# Patient Record
Sex: Female | Born: 1960 | Race: White | Hispanic: No | Marital: Married | Smoking: Current every day smoker
Health system: Southern US, Community
[De-identification: ages and names within clinical notes are randomized; demographics above are authoritative.]

## PROBLEM LIST (undated history)

## (undated) DIAGNOSIS — C439 Malignant melanoma of skin, unspecified: Secondary | ICD-10-CM

## (undated) DIAGNOSIS — F329 Major depressive disorder, single episode, unspecified: Secondary | ICD-10-CM

## (undated) DIAGNOSIS — F32A Depression, unspecified: Secondary | ICD-10-CM

## (undated) DIAGNOSIS — F419 Anxiety disorder, unspecified: Secondary | ICD-10-CM

## (undated) DIAGNOSIS — I10 Essential (primary) hypertension: Secondary | ICD-10-CM

## (undated) DIAGNOSIS — T7840XA Allergy, unspecified, initial encounter: Secondary | ICD-10-CM

## (undated) HISTORY — PX: TONSILLECTOMY AND ADENOIDECTOMY: SUR1326

## (undated) HISTORY — DX: Allergy, unspecified, initial encounter: T78.40XA

## (undated) HISTORY — DX: Malignant melanoma of skin, unspecified: C43.9

## (undated) HISTORY — PX: MELANOMA EXCISION: SHX5266

---

## 1998-07-23 ENCOUNTER — Emergency Department (HOSPITAL_COMMUNITY): Admission: EM | Admit: 1998-07-23 | Discharge: 1998-07-23 | Payer: Self-pay | Admitting: Emergency Medicine

## 2004-08-20 ENCOUNTER — Emergency Department: Payer: Self-pay | Admitting: Internal Medicine

## 2004-10-27 DIAGNOSIS — I10 Essential (primary) hypertension: Secondary | ICD-10-CM

## 2004-10-27 HISTORY — DX: Essential (primary) hypertension: I10

## 2012-05-07 ENCOUNTER — Other Ambulatory Visit (HOSPITAL_COMMUNITY)
Admission: RE | Admit: 2012-05-07 | Discharge: 2012-05-07 | Disposition: A | Discharge status: Home / Self Care | Payer: 59 | Source: Ambulatory Visit | Attending: Internal Medicine | Admitting: Internal Medicine

## 2012-05-07 DIAGNOSIS — Z01419 Encounter for gynecological examination (general) (routine) without abnormal findings: Secondary | ICD-10-CM | POA: Insufficient documentation

## 2012-05-07 DIAGNOSIS — Z1151 Encounter for screening for human papillomavirus (HPV): Secondary | ICD-10-CM | POA: Insufficient documentation

## 2012-05-14 ENCOUNTER — Other Ambulatory Visit: Payer: Self-pay | Admitting: Internal Medicine

## 2012-05-14 ENCOUNTER — Encounter: Payer: Self-pay | Admitting: Gastroenterology

## 2012-05-14 DIAGNOSIS — N63 Unspecified lump in unspecified breast: Secondary | ICD-10-CM

## 2012-05-19 ENCOUNTER — Other Ambulatory Visit: Payer: 59

## 2012-07-13 ENCOUNTER — Encounter: Payer: 59 | Admitting: Gastroenterology

## 2013-05-10 ENCOUNTER — Encounter: Payer: Self-pay | Admitting: Internal Medicine

## 2013-06-28 ENCOUNTER — Encounter: Payer: 59 | Admitting: Internal Medicine

## 2013-07-22 ENCOUNTER — Emergency Department (HOSPITAL_COMMUNITY)
Admission: EM | Admit: 2013-07-22 | Discharge: 2013-07-22 | Disposition: A | Discharge status: Home / Self Care | Payer: 59 | Attending: Emergency Medicine | Admitting: Emergency Medicine

## 2013-07-22 ENCOUNTER — Encounter (HOSPITAL_COMMUNITY): Payer: Self-pay

## 2013-07-22 DIAGNOSIS — F32A Depression, unspecified: Secondary | ICD-10-CM

## 2013-07-22 DIAGNOSIS — I10 Essential (primary) hypertension: Secondary | ICD-10-CM | POA: Insufficient documentation

## 2013-07-22 DIAGNOSIS — Y929 Unspecified place or not applicable: Secondary | ICD-10-CM | POA: Insufficient documentation

## 2013-07-22 DIAGNOSIS — T424X1A Poisoning by benzodiazepines, accidental (unintentional), initial encounter: Secondary | ICD-10-CM | POA: Insufficient documentation

## 2013-07-22 DIAGNOSIS — T503X1A Poisoning by electrolytic, caloric and water-balance agents, accidental (unintentional), initial encounter: Secondary | ICD-10-CM | POA: Insufficient documentation

## 2013-07-22 DIAGNOSIS — T502X1A Poisoning by carbonic-anhydrase inhibitors, benzothiadiazides and other diuretics, accidental (unintentional), initial encounter: Secondary | ICD-10-CM | POA: Insufficient documentation

## 2013-07-22 DIAGNOSIS — Y9389 Activity, other specified: Secondary | ICD-10-CM | POA: Insufficient documentation

## 2013-07-22 DIAGNOSIS — F3289 Other specified depressive episodes: Secondary | ICD-10-CM | POA: Insufficient documentation

## 2013-07-22 DIAGNOSIS — F329 Major depressive disorder, single episode, unspecified: Secondary | ICD-10-CM

## 2013-07-22 DIAGNOSIS — Z3202 Encounter for pregnancy test, result negative: Secondary | ICD-10-CM | POA: Insufficient documentation

## 2013-07-22 DIAGNOSIS — Z79899 Other long term (current) drug therapy: Secondary | ICD-10-CM | POA: Insufficient documentation

## 2013-07-22 DIAGNOSIS — T424X4A Poisoning by benzodiazepines, undetermined, initial encounter: Secondary | ICD-10-CM | POA: Insufficient documentation

## 2013-07-22 DIAGNOSIS — F411 Generalized anxiety disorder: Secondary | ICD-10-CM | POA: Insufficient documentation

## 2013-07-22 DIAGNOSIS — F17200 Nicotine dependence, unspecified, uncomplicated: Secondary | ICD-10-CM | POA: Insufficient documentation

## 2013-07-22 DIAGNOSIS — T50901A Poisoning by unspecified drugs, medicaments and biological substances, accidental (unintentional), initial encounter: Secondary | ICD-10-CM

## 2013-07-22 HISTORY — DX: Major depressive disorder, single episode, unspecified: F32.9

## 2013-07-22 HISTORY — DX: Depression, unspecified: F32.A

## 2013-07-22 HISTORY — DX: Anxiety disorder, unspecified: F41.9

## 2013-07-22 HISTORY — DX: Essential (primary) hypertension: I10

## 2013-07-22 LAB — RAPID URINE DRUG SCREEN, HOSP PERFORMED
Amphetamines: NOT DETECTED
Barbiturates: NOT DETECTED
Benzodiazepines: NOT DETECTED
Cocaine: NOT DETECTED
Opiates: NOT DETECTED

## 2013-07-22 LAB — COMPREHENSIVE METABOLIC PANEL
ALT: 16 U/L (ref 0–35)
AST: 20 U/L (ref 0–37)
Albumin: 3.9 g/dL (ref 3.5–5.2)
BUN: 10 mg/dL (ref 6–23)
CO2: 29 mEq/L (ref 19–32)
Chloride: 101 mEq/L (ref 96–112)
Creatinine, Ser: 0.74 mg/dL (ref 0.50–1.10)
GFR calc non Af Amer: >90 mL/min (ref >90)
Sodium: 141 mEq/L (ref 135–145)
Total Bilirubin: 0.1 mg/dL — ABNORMAL LOW (ref 0.3–1.2)
Total Protein: 7.3 g/dL (ref 6.0–8.3)

## 2013-07-22 LAB — SALICYLATE LEVEL: Salicylate Lvl: <2 mg/dL — ABNORMAL LOW (ref 2.8–20.0)

## 2013-07-22 LAB — ETHANOL: Alcohol, Ethyl (B): 67 mg/dL — ABNORMAL HIGH (ref 0–11)

## 2013-07-22 LAB — CBC
Hemoglobin: 14.9 g/dL (ref 12.0–15.0)
MCH: 31.7 pg (ref 26.0–34.0)
Platelets: 256 10*3/uL (ref 150–400)
RBC: 4.7 MIL/uL (ref 3.87–5.11)
RDW: 12.8 % (ref 11.5–15.5)
WBC: 6.7 10*3/uL (ref 4.0–10.5)

## 2013-07-22 LAB — POCT PREGNANCY, URINE: Preg Test, Ur: NEGATIVE

## 2013-07-22 LAB — ACETAMINOPHEN LEVEL: Acetaminophen (Tylenol), Serum: <15 ug/mL (ref 10–30)

## 2013-07-22 MED ORDER — ACETAMINOPHEN 325 MG PO TABS: 650.0000 mg | ORAL_TABLET | ORAL | Status: DC | PRN

## 2013-07-22 MED ORDER — ONDANSETRON HCL 4 MG PO TABS: 4.0000 mg | ORAL_TABLET | Freq: Three times a day (TID) | ORAL | Status: DC | PRN

## 2013-07-22 MED ORDER — ALUM & MAG HYDROXIDE-SIMETH 200-200-20 MG/5ML PO SUSP: 30.0000 mL | ORAL | Status: DC | PRN

## 2013-07-22 MED ORDER — LORATADINE 10 MG PO TABS
10.0000 mg | ORAL_TABLET | Freq: Every day | ORAL | Status: DC
  Filled 2013-07-22: qty 1

## 2013-07-22 MED ORDER — IRBESARTAN 75 MG PO TABS
37.5000 mg | ORAL_TABLET | Freq: Every day | ORAL | Status: DC
  Filled 2013-07-22: qty 0.5

## 2013-07-22 MED ORDER — DULOXETINE HCL 60 MG PO CPEP
60.0000 mg | ORAL_CAPSULE | Freq: Every day | ORAL | Status: DC
  Administered 2013-07-22: 60 mg via ORAL
  Filled 2013-07-22: qty 1

## 2013-07-22 MED ORDER — LORAZEPAM 1 MG PO TABS: 1.0000 mg | ORAL_TABLET | Freq: Three times a day (TID) | ORAL | Status: DC | PRN

## 2013-07-22 NOTE — ED Notes (Signed)
Pt is awake and alert, pleasant and cooperative. Patient denies HI, SI AH or VH. Discharge vitals 132/85 HR 88 RR 16 and unlabored. Pt has outpatient treatment scheduled. Will continue to monitor for safety. Patient escorted to lobby without incident. T.Melvyn Neth RN

## 2013-07-22 NOTE — BH Assessment (Signed)
Tele Assessment Note   Sylvia Mclean is an 52 y.o. female. Patient presents to ED after taking an extra 4 xanax pills last night. Patient states she took the pills to sleep and not deal with conflicts in her life. Denies any suicidal ideation. Denies any desire to just not wake up. Patient stated she wanted to get her problems off of her mind. States she has a conflict with her 13 year old son. States son and his girlfriend had a baby last month and they will not allow patient to see baby.  Patient states she has a confict with the son's girlfriend and they will not allow her to see the baby because she did not come to the hospital when the baby was born.   Patient also has ongoing conflict with her boss of 10 years.  Last night her husband was angry with her and blames her for the conflict with her son.  Husband is also not permitted to see the baby and he blames patient for that.  Patient denies any drug use. Admits to drinking 6 beers a night, 4-5 times per week. She denies withdrawal symptoms and has no desire to stop drinking. She has had anxiety for the past 10 years that has been managed with cymbalta and PRN xanax. The medications have been working up until the last 2 months, when she became depressed over her son. Patient states she has 1-2 panic attacks per month. Her primary care doctor manages her medication. Patient saw a therapist approximately 5-6 years ago to deal with difficult boss. She denies suicidal ideation and homicidal ideation. Her husband has a gun at home but she states she doesn't like guns and she does not touch the gun.  She feels safe returning home. Patient wants to return to a therapist on an outpatient basis.  Discussed patient with Dr. Ladona Ridgel. Dr. Ladona Ridgel recommends discharge with outpatient referrals. Dr. Ladona Ridgel will see patient this am.   Axis I: Depressive Disorder NOS Axis II: Deferred Axis III:  Past Medical History  Diagnosis Date  . Hypertension   . Anxiety    . Depression    Axis IV: other psychosocial or environmental problems Axis V: 51-60 moderate symptoms  Past Medical History:  Past Medical History  Diagnosis Date  . Hypertension   . Anxiety   . Depression     Past Surgical History  Procedure Laterality Date  . Cesarean section      Family History: History reviewed. No pertinent family history.  Social History:  reports that she has been smoking Cigarettes.  She has been smoking about 1.00 pack per day. She does not have any smokeless tobacco history on file. She reports that she drinks about 3.6 ounces of alcohol per week. She reports that she does not use illicit drugs.  Additional Social History:  Alcohol / Drug Use Pain Medications: Denies Prescriptions: As prescribed Over the Counter: Denies History of alcohol / drug use?: Yes Withdrawal Symptoms:  (Denies withdrawal symptoms) Substance #1 Name of Substance 1: Beer 1 - Amount (size/oz): 6 (12) oz cans 1 - Frequency: 4-5 times per week 1 - Duration: years 1 - Last Use / Amount: last night  CIWA: CIWA-Ar BP: 139/97 mmHg Pulse Rate: 88 COWS:    Allergies:  Allergies  Allergen Reactions  . Azithromycin Nausea And Vomiting    Home Medications:  (Not in a hospital admission)  OB/GYN Status:  Patient's last menstrual period was 05/30/2013.  General Assessment Data Location of  Assessment: [facility] ACT Assessment:  (No) Is this a Tele or Face-to-Face Assessment?: Tele Assessment Is this an Initial Assessment or a Re-assessment for this encounter?: Initial Assessment Living Arrangements: Spouse/significant other Can pt return to current living arrangement?: Yes Admission Status: Voluntary Is patient capable of signing voluntary admission?: Yes Transfer from: Home Referral Source: Self/Family/Friend     [facility] Crisis Care Plan Living Arrangements: Spouse/significant other  Education Status Is patient currently in school?: No  Risk to self Suicidal  Ideation: No Suicidal Intent: No Is patient at risk for suicide?: No Suicidal Plan?: No Access to Means: Yes Specify Access to Suicidal Means:  (Patient states they have a gun in the home ) What has been your use of drugs/alcohol within the last 12 months?:  (6 beers nightly 4-5 times per week. ) Previous Attempts/Gestures: No How many times?:  (na/) Other Self Harm Risks:  (none) Triggers for Past Attempts:  (na) Intentional Self Injurious Behavior: None Family Suicide History: No Recent stressful life event(s): Conflict (Comment);Other (Comment) (conflict at work, with son, and with husband) Persecutory voices/beliefs?: No Depression: Yes Depression Symptoms: Insomnia;Tearfulness;Fatigue;Guilt;Loss of interest in usual pleasures;Feeling angry/irritable Substance abuse history and/or treatment for substance abuse?: No Suicide prevention information given to non-admitted patients: Not applicable  Risk to Others Homicidal Ideation: No Thoughts of Harm to Others: No Current Homicidal Intent: No Current Homicidal Plan: No Access to Homicidal Means: No Identified Victim:  (na) History of harm to others?: No Assessment of Violence: None Noted Violent Behavior Description:  (na) Does patient have access to weapons?: Yes (Comment) (gun in the home) Criminal Charges Pending?: No Does patient have a court date: No  Psychosis Hallucinations: None noted Delusions: None noted  Mental Status Report Appear/Hygiene: Other (Comment) (unremarkable) Eye Contact: Good Motor Activity: Freedom of movement;Unremarkable Speech: Logical/coherent Level of Consciousness: Alert Mood: Depressed;Anxious Affect: Depressed Anxiety Level: Moderate Thought Processes: Coherent;Relevant Judgement: Unimpaired Orientation: Person;Place;Time;Situation Obsessive Compulsive Thoughts/Behaviors: None  Cognitive Functioning Concentration: Decreased Memory: Remote Intact;Recent Impaired (states "forgetful"  since depressed) IQ: Average Insight: Good Impulse Control: Fair Appetite: Good Weight Loss:  (none) Weight Gain:  (none) Sleep: Decreased Vegetative Symptoms: Staying in bed;Decreased grooming  ADLScreening [facility] Assessment Services) Patient's cognitive ability adequate to safely complete daily activities?: Yes Patient able to express need for assistance with ADLs?: Yes Independently performs ADLs?: Yes (appropriate for developmental age)  Prior Inpatient Therapy Prior Inpatient Therapy: No Prior Therapy Dates:  (na)  Prior Outpatient Therapy Prior Outpatient Therapy: Yes (approxm 5-6 years ago) Prior Therapy Facilty/Provider(s):  (Dr. Raina Mina) Reason for Treatment:  (difficulty with boss)  ADL Screening (condition at time of admission) Patient's cognitive ability adequate to safely complete daily activities?: Yes Is the patient deaf or have difficulty hearing?: No Does the patient have difficulty seeing, even when wearing glasses/contacts?: No Does the patient have difficulty concentrating, remembering, or making decisions?: No Patient able to express need for assistance with ADLs?: Yes Does the patient have difficulty dressing or bathing?: No Independently performs ADLs?: Yes (appropriate for developmental age) Does the patient have difficulty walking or climbing stairs?: No Weakness of Legs: None Weakness of Arms/Hands: None  Home Assistive Devices/Equipment Home Assistive Devices/Equipment: None    Abuse/Neglect Assessment (Assessment to be complete while patient is alone) Physical Abuse: Denies Verbal Abuse: Denies Sexual Abuse: Denies Exploitation of patient/patient's resources: Denies Self-Neglect: Denies Values / Beliefs Cultural Requests During Hospitalization: None Spiritual Requests During Hospitalization: None   Advance Directives (For Healthcare) Advance Directive: Patient does not have  advance directive;Patient would like information Patient requests  advance directive information: Other (Comment) Nutrition Screen- [facility] Adult/[facility] Patient's home diet: Regular  Additional Information 1:1 In Past 12 Months?: No CIRT Risk: No Elopement Risk: No Does patient have medical clearance?: Yes     Disposition:  Disposition Initial Assessment Completed for this Encounter: Yes Disposition of Patient: Outpatient treatment Type of outpatient treatment: Adult (Per Dr. Ladona Ridgel refer to a therapist)  Yates Decamp 07/22/2013 10:40 AM

## 2013-07-22 NOTE — ED Provider Notes (Signed)
CSN: [id number]     Arrival date & time 07/22/13  0016 History   First MD Initiated Contact with Patient 07/22/13 0031     Chief Complaint  Patient presents with  . Drug Overdose    HPI Patient presents to the emergency room after an overdose of her anxiety medication. Patient has been having stress regarding family situations with her son. Patient states sometime this evening approximately 2-3 hours ago she took 3 or 4 extra of her Xanax medications. She denies taking any extra blood pressure medications. She just took her usual dose. Patient states she was not trying to harm herself however she does admit she was thinking that the world would be better off without her. She did send her son text message stating that. 911 was called and the patient was brought to the emergency room. She denies any active suicidal ideation at this time. She does have a history of depression. She denies any prior overdose. Past Medical History  Diagnosis Date  . Hypertension   . Anxiety   . Depression    Past Surgical History  Procedure Laterality Date  . Cesarean section     History reviewed. No pertinent family history. History  Substance Use Topics  . Smoking status: Current Every Day Smoker -- 1.00 packs/day    Types: Cigarettes  . Smokeless tobacco: Not on file  . Alcohol Use: 3.6 oz/week    6 Cans of beer per week     Comment: nightly for months   OB History   Grav Para Term Preterm Abortions TAB SAB Ect Mult Living                 Review of Systems  All other systems reviewed and are negative.    Allergies  Azithromycin  Home Medications   Current Outpatient Rx  Name  Route  Sig  Dispense  Refill  . ALPRAZolam (XANAX) 0.25 MG tablet   Oral   Take 0.25 mg by mouth 3 (three) times daily as needed for anxiety.          . cetirizine (ZYRTEC) 10 MG tablet   Oral   Take 10 mg by mouth daily.         . DULoxetine (CYMBALTA) 60 MG capsule   Oral   Take 60 mg by mouth daily.        Marland Kitchen olmesartan (BENICAR) 5 MG tablet   Oral   Take 5 mg by mouth daily.         . Vitamin D, Ergocalciferol, (DRISDOL) 50000 UNITS CAPS capsule   Oral   Take 50,000 Units by mouth every Monday, Wednesday, and Friday.          BP 98/58  Pulse 83  Resp 16  SpO2 94%  LMP 05/30/2013 Physical Exam  Nursing note and vitals reviewed. Constitutional: No distress.  HENT:  Head: Normocephalic and atraumatic.  Right Ear: External ear normal.  Left Ear: External ear normal.  Eyes: Conjunctivae are normal. Right eye exhibits no discharge. Left eye exhibits no discharge. No scleral icterus.  Neck: Neck supple. No tracheal deviation present.  Cardiovascular: Normal rate, regular rhythm and intact distal pulses.   Pulmonary/Chest: Effort normal and breath sounds normal. No stridor. No respiratory distress. She has no wheezes. She has no rales.  Abdominal: Soft. Bowel sounds are normal. She exhibits no distension. There is no tenderness. There is no rebound and no guarding.  Musculoskeletal: She exhibits no edema and no tenderness.  Neurological: She is alert. She has normal strength. No sensory deficit. Cranial nerve deficit:  no gross defecits noted. She exhibits normal muscle tone. She displays no seizure activity. Coordination normal.  Skin: Skin is warm and dry. No rash noted.  Psychiatric: She has a normal mood and affect.    ED Course  Procedures (including critical care time) Labs Review Labs Reviewed  COMPREHENSIVE METABOLIC PANEL - Abnormal; Notable for the following:    Glucose, Bld 100 (*)    Total Bilirubin 0.1 (*)    All other components within normal limits  ETHANOL - Abnormal; Notable for the following:    Alcohol, Ethyl (B) 67 (*)    All other components within normal limits  SALICYLATE LEVEL - Abnormal; Notable for the following:    Salicylate Lvl <2.0 (*)    All other components within normal limits  CBC  ACETAMINOPHEN LEVEL  URINE RAPID DRUG SCREEN (HOSP  PERFORMED)  POCT PREGNANCY, URINE   Imaging Review No results found.  MDM   1. Overdose, initial encounter   2. Depression    [phone]  Pt is medically cleared from her overdose.  Will have pt evaluated by the psychiatry team regarding her depression and possible suicide attempt.    Celene Kras, MD 07/22/13 [phone]

## 2013-07-22 NOTE — ED Notes (Signed)
Bed: [facility] Expected date: 07/22/13 Expected time: 12:01 AM Means of arrival: Ambulance Comments: SI, etoh, overdose on xanax

## 2013-07-22 NOTE — ED Notes (Signed)
TelePsych at bedside.

## 2013-07-22 NOTE — ED Provider Notes (Signed)
[facility] recs discharge and Pt agrees.  Hurman Horn, MD 07/23/13 [phone]

## 2013-07-22 NOTE — BH Assessment (Signed)
[facility] Assessment Progress Note Called [facility] and scheduled pt's telepsych appt for 0830.  [facility] secretary to inform pt's nurse.

## 2013-07-22 NOTE — Progress Notes (Signed)
Chaplain provided support with pt at bedside in [facility].     Pt requested information around advance directives.  Chaplain provided paperwork and spoke with pt about healthcare power of attorney and living will.   Provided support around grief and fear related to pt not being able to see granddaughter or son.   Belva Crome MDiv

## 2013-07-22 NOTE — ED Notes (Signed)
Per EMS, pt from home.  Pt has had a lot of family stress recently.  Hx depression, htn, anxiety.  Domestic issues at home.  Sent text message to her son saying that world would be better off without her.  Admitted that she took few "extra xanax and benicar".  Unsure of amount.  Says that she just wanted to sleep.  No history of same.  Pt is nauseous.  No vomiting.  cbg 90.  Vitals:164/52, 98% Ra, hr 80's.

## 2013-07-22 NOTE — ED Notes (Signed)
Pt is currently asking for no visitors.  Informed to let me know, if she changes her mind.

## 2013-07-22 NOTE — Progress Notes (Signed)
   CARE MANAGEMENT ED NOTE 07/22/2013  Patient:  Sylvia Mclean, Sylvia Mclean   Account Number:  [id number]  Date Initiated:  07/22/2013  Documentation initiated by:  Edd Arbour  Subjective/Objective Assessment:     Subjective/Objective Assessment Detail:   52 year old female [employer] coverage No pcp listed from home.  Pt has had a lot of family stress recently.  Hx depression, htn, anxiety.  Domestic issues at home.  Sent text message to her son saying that world would be better off without her.  Admitted that she took few "extra xanax and benicar".  Unsure of amount.  Says that she just wanted to sleep.     Action/Plan:   Action/Plan Detail:   Anticipated DC Date:       Status Recommendation to Physician:   Result of Recommendation:    Other ED Services  Consult Working Plan    DC Planning Services  Other  PCP issues  Outpatient Services - Pt will follow up    Choice offered to / List presented to:            Status of service:  Completed, signed off  ED Comments:   ED Comments Detail:  [facility] ED CM noted pt without pcp CM spoke with pt who states she sees a provider off of [address] [place] with the first name amanda, PA but can not recall the last name. Cm able to find Quentin Mulling PA listed near [address] [place] [facility] updated. Reports she previously saw Dr Ricky Stabs prior to this provider

## 2013-07-22 NOTE — ED Notes (Signed)
Pts husband came to see patient and it was explained to him that visiting hours were 9-9p, he states that he will be back in the am. Pt gave permission to give him information about her.

## 2013-07-22 NOTE — Consult Note (Signed)
[facility] Face-to-Face Psychiatry Consult   Reason for Consult:  Depressed and took extra Xanax with alcohol Referring Physician:  ER MD  Sylvia Mclean is an 52 y.o. female.  Assessment: AXIS I:  Major Depression, Recurrent severe AXIS II:  Deferred AXIS III:   Past Medical History  Diagnosis Date  . Hypertension   . Anxiety   . Depression    AXIS IV:  other psychosocial or environmental problems AXIS V:  51-60 moderate symptoms  Plan:  Patient does not meet criteria for psychiatric inpatient admission.  Subjective:   Sylvia Mclean is a 52 y.o. female patient admitted with depression .  HPI:  Ms Pask has been depressed for a long time and takes Cymbalta which has helped.  Her son and his wife recently had a baby but refuses to let her or her husband see the baby reportedly because his wife wants it that way.  Her husband blames her for the situation.  She has a very stressful job which has led to daily increased anxiety.  She takes Xanax for anxiety and that helps.  Last night she took 2 mg extra last night in addition to drinking just to go to sleep not to kill herself.  She wants to go home,  wants to go to her job.  She is not suicidal so does not meet criteria for involuntary commitment, so I recommend discharge ome today  HPI Elements:   Location:  ER. Quality:  stressed out but can cope. Severity:  moderate. Timing:  last 2 monthss. Duration:  2 months. Context:  family relationships.  Past Psychiatric History: Past Medical History  Diagnosis Date  . Hypertension   . Anxiety   . Depression     reports that she has been smoking Cigarettes.  She has been smoking about 1.00 pack per day. She does not have any smokeless tobacco history on file. She reports that she drinks about 3.6 ounces of alcohol per week. She reports that she does not use illicit drugs. History reviewed. No pertinent family history. Family History Substance Abuse: Yes, Describe: (Father alcoholic) Family  Supports: Yes, List: (mother and husband) Living Arrangements: Spouse/significant other Can pt return to current living arrangement?: Yes Abuse/Neglect [facility]) Physical Abuse: Denies Verbal Abuse: Denies Sexual Abuse: Denies Allergies:   Allergies  Allergen Reactions  . Azithromycin Nausea And Vomiting    ACT Assessment Complete:  Yes:    Educational Status    Risk to Self: Risk to self Suicidal Ideation: No Suicidal Intent: No Is patient at risk for suicide?: No Suicidal Plan?: No Access to Means: Yes Specify Access to Suicidal Means:  (Patient states they have a gun in the home ) What has been your use of drugs/alcohol within the last 12 months?:  (6 beers nightly 4-5 times per week. ) Previous Attempts/Gestures: No How many times?:  (na/) Other Self Harm Risks:  (none) Triggers for Past Attempts:  (na) Intentional Self Injurious Behavior: None Family Suicide History: No Recent stressful life event(s): Conflict (Comment);Other (Comment) (conflict at work, with son, and with husband) Persecutory voices/beliefs?: No Depression: Yes Depression Symptoms: Insomnia;Tearfulness;Fatigue;Guilt;Loss of interest in usual pleasures;Feeling angry/irritable Substance abuse history and/or treatment for substance abuse?: No Suicide prevention information given to non-admitted patients: Not applicable  Risk to Others: Risk to Others Homicidal Ideation: No Thoughts of Harm to Others: No Current Homicidal Intent: No Current Homicidal Plan: No Access to Homicidal Means: No Identified Victim:  (na) History of harm to others?: No Assessment of  Violence: None Noted Violent Behavior Description:  (na) Does patient have access to weapons?: Yes (Comment) (gun in the home) Criminal Charges Pending?: No Does patient have a court date: No  Abuse: Abuse/Neglect Assessment (Assessment to be complete while patient is alone) Physical Abuse: Denies Verbal Abuse: Denies Sexual Abuse:  Denies Exploitation of patient/patient's resources: Denies Self-Neglect: Denies  Prior Inpatient Therapy: Prior Inpatient Therapy Prior Inpatient Therapy: No Prior Therapy Dates:  (na)  Prior Outpatient Therapy: Prior Outpatient Therapy Prior Outpatient Therapy: Yes (approxm 5-6 years ago) Prior Therapy Facilty/Provider(s):  (Dr. Raina Mina) Reason for Treatment:  (difficulty with boss)  Additional Information: Additional Information 1:1 In Past 12 Months?: No CIRT Risk: No Elopement Risk: No Does patient have medical clearance?: Yes                  Objective: Blood pressure 139/97, pulse 88, resp. rate 21, last menstrual period 05/30/2013, SpO2 98.00%.There is no height or weight on file to calculate BMI. Results for orders placed during the hospital encounter of 07/22/13 (from the past 72 hour(s))  URINE RAPID DRUG SCREEN (HOSP PERFORMED)     Status: None   Collection Time    07/22/13 12:37 AM      Result Value Range   Opiates NONE DETECTED  NONE DETECTED   Cocaine NONE DETECTED  NONE DETECTED   Benzodiazepines NONE DETECTED  NONE DETECTED   Amphetamines NONE DETECTED  NONE DETECTED   Tetrahydrocannabinol NONE DETECTED  NONE DETECTED   Barbiturates NONE DETECTED  NONE DETECTED   Comment:            DRUG SCREEN FOR MEDICAL PURPOSES     ONLY.  IF CONFIRMATION IS NEEDED     FOR ANY PURPOSE, NOTIFY LAB     WITHIN 5 DAYS.                LOWEST DETECTABLE LIMITS     FOR URINE DRUG SCREEN     Drug Class       Cutoff (ng/mL)     Amphetamine      1000     Barbiturate      200     Benzodiazepine   200     Tricyclics       300     Opiates          300     Cocaine          300     THC              50  POCT PREGNANCY, URINE     Status: None   Collection Time    07/22/13 12:49 AM      Result Value Range   Preg Test, Ur NEGATIVE  NEGATIVE   Comment:            THE SENSITIVITY OF THIS     METHODOLOGY IS >24 mIU/mL  CBC     Status: None   Collection Time    07/22/13   1:00 AM      Result Value Range   WBC 6.7  4.0 - 10.5 K/uL   RBC 4.70  3.87 - 5.11 MIL/uL   Hemoglobin 14.9  12.0 - 15.0 g/dL   HCT 16.1  09.6 - 04.5 %   MCV 92.6  78.0 - 100.0 fL   MCH 31.7  26.0 - 34.0 pg   MCHC 34.3  30.0 - 36.0 g/dL   RDW 40.9  81.1 - 91.4 %  Platelets 256  150 - 400 K/uL  COMPREHENSIVE METABOLIC PANEL     Status: Abnormal   Collection Time    07/22/13  1:00 AM      Result Value Range   Sodium 141  135 - 145 mEq/L   Potassium 4.2  3.5 - 5.1 mEq/L   Chloride 101  96 - 112 mEq/L   CO2 29  19 - 32 mEq/L   Glucose, Bld 100 (*) 70 - 99 mg/dL   BUN 10  6 - 23 mg/dL   Creatinine, Ser 4.54  0.50 - 1.10 mg/dL   Calcium 09.8  8.4 - 11.9 mg/dL   Total Protein 7.3  6.0 - 8.3 g/dL   Albumin 3.9  3.5 - 5.2 g/dL   AST 20  0 - 37 U/L   ALT 16  0 - 35 U/L   Alkaline Phosphatase 80  39 - 117 U/L   Total Bilirubin 0.1 (*) 0.3 - 1.2 mg/dL   GFR calc non Af Amer >90  >90 mL/min   GFR calc Af Amer >90  >90 mL/min   Comment: (NOTE)     The eGFR has been calculated using the CKD EPI equation.     This calculation has not been validated in all clinical situations.     eGFR's persistently <90 mL/min signify possible Chronic Kidney     Disease.  ETHANOL     Status: Abnormal   Collection Time    07/22/13  1:00 AM      Result Value Range   Alcohol, Ethyl (B) 67 (*) 0 - 11 mg/dL   Comment:            LOWEST DETECTABLE LIMIT FOR     SERUM ALCOHOL IS 11 mg/dL     FOR MEDICAL PURPOSES ONLY  ACETAMINOPHEN LEVEL     Status: None   Collection Time    07/22/13  1:00 AM      Result Value Range   Acetaminophen (Tylenol), Serum <15.0  10 - 30 ug/mL   Comment:            THERAPEUTIC CONCENTRATIONS VARY     SIGNIFICANTLY. A RANGE OF 10-30     ug/mL MAY BE AN EFFECTIVE     CONCENTRATION FOR MANY PATIENTS.     HOWEVER, SOME ARE BEST TREATED     AT CONCENTRATIONS OUTSIDE THIS     RANGE.     ACETAMINOPHEN CONCENTRATIONS     >150 ug/mL AT 4 HOURS AFTER     INGESTION AND >50 ug/mL  AT 12     HOURS AFTER INGESTION ARE     OFTEN ASSOCIATED WITH TOXIC     REACTIONS.  SALICYLATE LEVEL     Status: Abnormal   Collection Time    07/22/13  1:00 AM      Result Value Range   Salicylate Lvl <2.0 (*) 2.8 - 20.0 mg/dL   Labs are reviewed and are pertinent for no psychiatric issue.  Current Facility-Administered Medications  Medication Dose Route Frequency Provider Last Rate Last Dose  . acetaminophen (TYLENOL) tablet 650 mg  650 mg Oral Q4H PRN Celene Kras, MD      . alum & mag hydroxide-simeth (MAALOX/MYLANTA) 200-200-20 MG/5ML suspension 30 mL  30 mL Oral PRN Celene Kras, MD      . DULoxetine (CYMBALTA) DR capsule 60 mg  60 mg Oral Daily Celene Kras, MD   60 mg at 07/22/13 1025  . irbesartan (AVAPRO)  tablet 37.5 mg  37.5 mg Oral Daily Celene Kras, MD      . loratadine (CLARITIN) tablet 10 mg  10 mg Oral Daily Celene Kras, MD      . LORazepam (ATIVAN) tablet 1 mg  1 mg Oral Q8H PRN Celene Kras, MD      . ondansetron [facility]) tablet 4 mg  4 mg Oral Q8H PRN Celene Kras, MD       Current Outpatient Prescriptions  Medication Sig Dispense Refill  . ALPRAZolam (XANAX) 0.25 MG tablet Take 0.25 mg by mouth 3 (three) times daily as needed for anxiety.       . cetirizine (ZYRTEC) 10 MG tablet Take 10 mg by mouth daily.      . DULoxetine (CYMBALTA) 60 MG capsule Take 60 mg by mouth daily.      Marland Kitchen olmesartan (BENICAR) 5 MG tablet Take 5 mg by mouth daily.      . Vitamin D, Ergocalciferol, (DRISDOL) 50000 UNITS CAPS capsule Take 50,000 Units by mouth every Monday, Wednesday, and Friday.        Psychiatric Specialty Exam:     Blood pressure 139/97, pulse 88, resp. rate 21, last menstrual period 05/30/2013, SpO2 98.00%.There is no height or weight on file to calculate BMI.  General Appearance: Casual  Eye Contact::  Good  Speech:  Clear and Coherent and Normal Rate  Volume:  Normal  Mood:  Anxious  Affect:  Appropriate  Thought Process:  Coherent, Goal Directed and Logical   Orientation:  Full (Time, Place, and Person)  Thought Content:  Negative  Suicidal Thoughts:  No  Homicidal Thoughts:  No  Memory:  Immediate;   Good Recent;   Good Remote;   Good  Judgement:  Good  Insight:  Good  Psychomotor Activity:  Normal  Concentration:  Good  Recall:  Good  Akathisia:  Negative  Handed:  Right  AIMS (if indicated):     Assets:  Communication Skills Desire for Improvement Financial Resources/Insurance Housing Intimacy Leisure Time Physical Health Talents/Skills Transportation Vocational/Educational  Sleep:      Treatment Plan Summary: discharge home today  Santhiago Collingsworth D 07/22/2013 11:01 AM

## 2013-08-05 ENCOUNTER — Encounter: Payer: Self-pay | Admitting: *Deleted

## 2013-08-05 ENCOUNTER — Telehealth: Payer: Self-pay | Admitting: *Deleted

## 2013-08-05 NOTE — Telephone Encounter (Signed)
Phone call to patient regarding no show for previsit appointment 08/05/13 800 am. Message left to call and reschedule previsit before 5 pm close today in order to keep colonoscopy scheduled for 08/19/13. Should appointment not be rescheduled today, colonoscopy will be cancelled and both appointments would need to be rescheduled for future.

## 2013-08-12 ENCOUNTER — Encounter: Payer: Self-pay | Admitting: Internal Medicine

## 2013-08-19 ENCOUNTER — Encounter: Payer: 59 | Admitting: Internal Medicine

## 2013-09-21 ENCOUNTER — Other Ambulatory Visit: Payer: Self-pay | Admitting: Internal Medicine

## 2013-10-24 ENCOUNTER — Other Ambulatory Visit: Payer: Self-pay | Admitting: Physician Assistant

## 2013-10-24 MED ORDER — VITAMIN D (ERGOCALCIFEROL) 1.25 MG (50000 UNIT) PO CAPS: 50000.0000 [IU] | ORAL_CAPSULE | ORAL | Status: DC

## 2013-11-17 ENCOUNTER — Other Ambulatory Visit: Payer: Self-pay | Admitting: Internal Medicine

## 2013-11-18 ENCOUNTER — Other Ambulatory Visit: Payer: Self-pay | Admitting: Internal Medicine

## 2013-11-21 DIAGNOSIS — I10 Essential (primary) hypertension: Secondary | ICD-10-CM | POA: Insufficient documentation

## 2013-11-21 DIAGNOSIS — T7840XA Allergy, unspecified, initial encounter: Secondary | ICD-10-CM | POA: Insufficient documentation

## 2013-11-21 DIAGNOSIS — F325 Major depressive disorder, single episode, in full remission: Secondary | ICD-10-CM | POA: Insufficient documentation

## 2013-11-21 DIAGNOSIS — J309 Allergic rhinitis, unspecified: Secondary | ICD-10-CM | POA: Insufficient documentation

## 2013-11-21 DIAGNOSIS — F419 Anxiety disorder, unspecified: Secondary | ICD-10-CM | POA: Insufficient documentation

## 2013-11-22 ENCOUNTER — Ambulatory Visit: Payer: Self-pay | Admitting: Emergency Medicine

## 2013-11-24 ENCOUNTER — Ambulatory Visit: Payer: Self-pay | Admitting: Physician Assistant

## 2013-11-24 ENCOUNTER — Ambulatory Visit: Payer: Self-pay | Admitting: Emergency Medicine

## 2013-12-15 ENCOUNTER — Encounter: Payer: Self-pay | Admitting: Physician Assistant

## 2013-12-15 ENCOUNTER — Ambulatory Visit (INDEPENDENT_AMBULATORY_CARE_PROVIDER_SITE_OTHER): Payer: 59 | Admitting: Physician Assistant

## 2013-12-15 VITALS — BP 128/82 | HR 76 | Temp 97.9°F | Resp 16 | Ht 62.0 in | Wt 160.0 lb

## 2013-12-15 DIAGNOSIS — N951 Menopausal and female climacteric states: Secondary | ICD-10-CM

## 2013-12-15 DIAGNOSIS — R232 Flushing: Secondary | ICD-10-CM

## 2013-12-15 DIAGNOSIS — G4700 Insomnia, unspecified: Secondary | ICD-10-CM

## 2013-12-15 DIAGNOSIS — F411 Generalized anxiety disorder: Secondary | ICD-10-CM

## 2013-12-15 DIAGNOSIS — K219 Gastro-esophageal reflux disease without esophagitis: Secondary | ICD-10-CM

## 2013-12-15 MED ORDER — ESOMEPRAZOLE MAGNESIUM 40 MG PO CPDR: 40.0000 mg | DELAYED_RELEASE_CAPSULE | Freq: Every day | ORAL | Status: DC

## 2013-12-15 MED ORDER — ALPRAZOLAM 0.5 MG PO TABS: 0.5000 mg | ORAL_TABLET | Freq: Three times a day (TID) | ORAL | Status: DC | PRN

## 2013-12-15 NOTE — Patient Instructions (Addendum)
[facility]- call [phone] Call for colonoscopy  Different supplement Sylvia Mclean

## 2013-12-15 NOTE — Progress Notes (Signed)
HPI Patient presents for a folllow up of 6 months, all of her labs were good last time. Her BP is controlled. She states that she is having hot flashes at night and it is decreasing her sleep, no menses since mid Dec. She takes xanax at night and this helps her sleep. Denies CP, SOB, nausea, palpitations.   Past Medical History  Diagnosis Date  . Hypertension   . Depression   . Anxiety   . Allergy      Allergies  Allergen Reactions  . Azithromycin Nausea And Vomiting     Current Outpatient Prescriptions on File Prior to Visit  Medication Sig Dispense Refill  . ALPRAZolam (XANAX) 0.5 MG tablet TAKE 1/2 TO 1 TABLET BY MOUTH THREE TIMES A DAY AS NEEDED FOR ANXIETY  90 tablet  0  . BENICAR HCT 20-12.5 MG per tablet TAKE ONE (1) TABLET BY MOUTH EVERY DAY  30 tablet  2  . cetirizine (ZYRTEC) 10 MG tablet Take 10 mg by mouth daily.      . DULoxetine (CYMBALTA) 60 MG capsule Take 60 mg by mouth daily.      . Vitamin D, Ergocalciferol, (DRISDOL) 50000 UNITS CAPS capsule Take 1 capsule (50,000 Units total) by mouth every Monday, Wednesday, and Friday.  12 capsule  2   No current facility-administered medications on file prior to visit.    ROS: all negative expect above.   Physical: Filed Weights   12/15/13 1110  Weight: 160 lb (72.576 kg)   Filed Vitals:   12/15/13 1110  BP: 128/82  Pulse: 76  Temp: 97.9 F (36.6 C)  Resp: 16   General Appearance: Well nourished, in no apparent distress. Eyes: PERRLA, EOMs. Sinuses: No Frontal/maxillary tenderness ENT/Mouth: Ext aud canals clear, normal light reflex with TMs without erythema, bulging. Post pharynx without erythema, swelling, exudate.  Respiratory: CTAB Cardio: RRR, no murmurs, rubs or gallops. Peripheral pulses brisk and equal bilaterally, without edema. No aortic or femoral bruits. Abdomen: Soft, with bowl sounds. Nontender, no guarding, rebound. Lymphatics: Non tender without lymphadenopathy.  Musculoskeletal: Full ROM all  peripheral extremities, 5/5 strength, and normal gait. Skin: Warm, dry without rashes, lesions, ecchymosis.  Neuro: Cranial nerves intact, reflexes equal bilaterally. Normal muscle tone, no cerebellar symptoms. Sensation intact.  Pysch: Awake and oriented X 3, normal affect, Insight and Judgment appropriate.   Assessment and Plan: 1) Anxiety- Xanax 0.5#90 1 refill 2) GERD- nexium refill 3) Hot flashes- does not want to recheck [facility] or labs, and would not want estrogen supplement.

## 2014-01-04 ENCOUNTER — Other Ambulatory Visit: Payer: Self-pay

## 2014-01-04 MED ORDER — OLMESARTAN MEDOXOMIL-HCTZ 20-12.5 MG PO TABS: 1.0000 | ORAL_TABLET | Freq: Every day | ORAL | Status: DC

## 2014-02-01 ENCOUNTER — Other Ambulatory Visit: Payer: Self-pay

## 2014-02-01 MED ORDER — VITAMIN D (ERGOCALCIFEROL) 1.25 MG (50000 UNIT) PO CAPS: 50000.0000 [IU] | ORAL_CAPSULE | ORAL | Status: DC

## 2014-02-17 ENCOUNTER — Other Ambulatory Visit: Payer: Self-pay | Admitting: Internal Medicine

## 2014-03-03 ENCOUNTER — Other Ambulatory Visit: Payer: Self-pay | Admitting: Internal Medicine

## 2014-03-08 ENCOUNTER — Other Ambulatory Visit: Payer: Self-pay | Admitting: Physician Assistant

## 2014-03-08 MED ORDER — ALPRAZOLAM 0.5 MG PO TABS: 0.5000 mg | ORAL_TABLET | Freq: Three times a day (TID) | ORAL | Status: DC | PRN

## 2014-04-07 ENCOUNTER — Encounter: Payer: Self-pay | Admitting: Emergency Medicine

## 2014-04-07 ENCOUNTER — Ambulatory Visit (INDEPENDENT_AMBULATORY_CARE_PROVIDER_SITE_OTHER): Payer: 59 | Admitting: Emergency Medicine

## 2014-04-07 VITALS — BP 110/70 | HR 76 | Temp 98.1°F | Resp 16 | Ht 62.0 in | Wt 157.0 lb

## 2014-04-07 DIAGNOSIS — J309 Allergic rhinitis, unspecified: Secondary | ICD-10-CM

## 2014-04-07 DIAGNOSIS — J209 Acute bronchitis, unspecified: Secondary | ICD-10-CM

## 2014-04-07 DIAGNOSIS — J329 Chronic sinusitis, unspecified: Secondary | ICD-10-CM

## 2014-04-07 MED ORDER — ALBUTEROL SULFATE HFA 108 (90 BASE) MCG/ACT IN AERS: 2.0000 | INHALATION_SPRAY | Freq: Four times a day (QID) | RESPIRATORY_TRACT | Status: DC | PRN

## 2014-04-07 MED ORDER — PREDNISONE 10 MG PO TABS: ORAL_TABLET | ORAL | Status: DC

## 2014-04-07 MED ORDER — AMOXICILLIN 500 MG PO CAPS: 500.0000 mg | ORAL_CAPSULE | Freq: Three times a day (TID) | ORAL | Status: DC

## 2014-04-07 MED ORDER — BENZONATATE 100 MG PO CAPS: 100.0000 mg | ORAL_CAPSULE | Freq: Three times a day (TID) | ORAL | Status: DC | PRN

## 2014-04-07 NOTE — Patient Instructions (Signed)
Bronchitis °Bronchitis is swelling (inflammation) of the air tubes leading to your lungs (bronchi). This causes mucus and a cough. If the swelling gets bad, you may have trouble breathing. °HOME CARE  °· Rest. °· Drink enough fluids to keep your pee (urine) clear or pale yellow (unless you have a condition where you have to watch how much you drink). °· Only take medicine as told by your doctor. If you were given antibiotic medicines, finish them even if you start to feel better. °· Avoid smoke, irritating chemicals, and strong smells. These make the problem worse. Quit smoking if you smoke. This helps your lungs heal faster. °· Use a cool mist humidifier. Change the water in the humidifier every day. You can also sit in the bathroom with hot shower running for 5 10 minutes. Keep the door closed. °· See your health care provider as told. °· Wash your hands often. °GET HELP IF: °Your problems do not get better after 1 week. °GET HELP RIGHT AWAY IF:  °· Your fever gets worse. °· You have chills. °· Your chest hurts. °· Your problems breathing get worse. °· You have blood in your mucus. °· You pass out (faint). °· You feel lightheaded. °· You have a bad headache. °· You throw up (vomit) again and again. °MAKE SURE YOU: °· Understand these instructions. °· Will watch your condition. °· Will get help right away if you are not doing well or get worse. °Document Released: 03/31/2008 Document Revised: 08/03/2013 Document Reviewed: 06/07/2013 °[employer]® Patient Information ©2014 [employer]. ° °

## 2014-04-07 NOTE — Progress Notes (Signed)
   Subjective:    Patient ID: Sylvia Mclean, female    DOB: [birth year], 53 y.o.   MRN: [id number]  HPI Comments: 53 yo had increased cold symptoms x 1 week. She has had green cough production x 2 days. She notes increased sinus pressure and ear pain with cough. She has sore throat and hoarseness. She has tried OTC w/o relief.  Sinus Problem Associated symptoms include congestion, coughing and sinus pressure.      Medication List       This list is accurate as of: 04/07/14 11:56 AM.  Always use your most recent med list.               acyclovir 400 MG tablet  Commonly known as:  ZOVIRAX  TAKE ONE TABLET 3 TIMES A DAY AS NEEDED.     ALPRAZolam 0.5 MG tablet  Commonly known as:  XANAX  Take 1 tablet (0.5 mg total) by mouth 3 (three) times daily as needed for anxiety.     cetirizine 10 MG tablet  Commonly known as:  ZYRTEC  Take 10 mg by mouth daily.     DULoxetine 60 MG capsule  Commonly known as:  CYMBALTA  TAKE ONE CAPSULE BY MOUTH DAILY     esomeprazole 40 MG capsule  Commonly known as:  NEXIUM  Take 1 capsule (40 mg total) by mouth daily.     olmesartan-hydrochlorothiazide 20-12.5 MG per tablet  Commonly known as:  BENICAR HCT  Take 1 tablet by mouth daily.     Vitamin D (Ergocalciferol) 50000 UNITS Caps capsule  Commonly known as:  DRISDOL  Take 1 capsule (50,000 Units total) by mouth every Monday, Wednesday, and Friday.       Allergies  Allergen Reactions  . Azithromycin Nausea And Vomiting   Past Medical History  Diagnosis Date  . Hypertension   . Depression   . Anxiety   . Allergy      Review of Systems  HENT: Positive for congestion, postnasal drip and sinus pressure.   Respiratory: Positive for cough.   All other systems reviewed and are negative.  BP 110/70  Pulse 76  Temp(Src) 98.1 F (36.7 C)  Resp 16  Ht 5\' 2"  (1.575 m)  Wt 157 lb (71.215 kg)  BMI 28.71 kg/m2     Objective:   Physical Exam  Nursing note and vitals  reviewed. Constitutional: She is oriented to person, place, and time. She appears well-developed and well-nourished.  HENT:  Head: Normocephalic and atraumatic.  Right Ear: External ear normal.  Left Ear: External ear normal.  Nose: Nose normal.  Mouth/Throat: Oropharynx is clear and moist. No oropharyngeal exudate.  Yellow TMs bilateral, Maxillary tenderness   Eyes: Conjunctivae and EOM are normal.  Neck: Normal range of motion.  Cardiovascular: Normal rate, regular rhythm, normal heart sounds and intact distal pulses.   Pulmonary/Chest: Effort normal and breath sounds normal.  Musculoskeletal: Normal range of motion.  Lymphadenopathy:    She has no cervical adenopathy.  Neurological: She is alert and oriented to person, place, and time.  Skin: Skin is warm and dry.  Psychiatric: She has a normal mood and affect. Judgment normal.          Assessment & Plan:  Sinusitis/ Bronchitis/ Allergic rhinitis- Allegra OTC, increase H2o, allergy hygiene explained. Amox 500 mg TID, Tessalon Perles 200 mg,  Pred DP 10 mg AD, ALB HFA AD

## 2014-05-10 ENCOUNTER — Ambulatory Visit (INDEPENDENT_AMBULATORY_CARE_PROVIDER_SITE_OTHER): Payer: 59 | Admitting: Physician Assistant

## 2014-05-10 ENCOUNTER — Encounter: Payer: Self-pay | Admitting: Physician Assistant

## 2014-05-10 VITALS — BP 110/70 | HR 76 | Temp 97.7°F | Resp 16 | Ht 62.0 in | Wt 158.0 lb

## 2014-05-10 DIAGNOSIS — F17200 Nicotine dependence, unspecified, uncomplicated: Secondary | ICD-10-CM

## 2014-05-10 DIAGNOSIS — I10 Essential (primary) hypertension: Secondary | ICD-10-CM

## 2014-05-10 DIAGNOSIS — Z0000 Encounter for general adult medical examination without abnormal findings: Secondary | ICD-10-CM

## 2014-05-10 DIAGNOSIS — N912 Amenorrhea, unspecified: Secondary | ICD-10-CM

## 2014-05-10 LAB — CBC WITH DIFFERENTIAL/PLATELET
Basophils Absolute: 0.1 10*3/uL (ref 0.0–0.1)
Basophils Relative: 1 % (ref 0–1)
EOS ABS: 0.2 10*3/uL (ref 0.0–0.7)
EOS PCT: 4 % (ref 0–5)
HEMATOCRIT: 45.3 % (ref 36.0–46.0)
Hemoglobin: 15.9 g/dL — ABNORMAL HIGH (ref 12.0–15.0)
Lymphocytes Relative: 35 % (ref 12–46)
Lymphs Abs: 2.1 10*3/uL (ref 0.7–4.0)
MCH: 32.7 pg (ref 26.0–34.0)
MCHC: 35.1 g/dL (ref 30.0–36.0)
MCV: 93.2 fL (ref 78.0–100.0)
MONO ABS: 0.6 10*3/uL (ref 0.1–1.0)
Monocytes Relative: 10 % (ref 3–12)
Neutro Abs: 3 10*3/uL (ref 1.7–7.7)
Neutrophils Relative %: 50 % (ref 43–77)
Platelets: 303 10*3/uL (ref 150–400)
RBC: 4.86 MIL/uL (ref 3.87–5.11)
RDW: 14.2 % (ref 11.5–15.5)
WBC: 5.9 10*3/uL (ref 4.0–10.5)

## 2014-05-10 LAB — HEMOGLOBIN A1C
Hgb A1c MFr Bld: 5.6 % (ref <5.7)
Mean Plasma Glucose: 114 mg/dL (ref <117)

## 2014-05-10 MED ORDER — VARENICLINE TARTRATE 1 MG PO TABS: 1.0000 mg | ORAL_TABLET | Freq: Two times a day (BID) | ORAL | Status: DC

## 2014-05-10 NOTE — Patient Instructions (Addendum)
[facility] Address: [address], [place], [place] [address]  Phone: [phone]  Need 3D MAMMOGRAM  Colon cancer is 3rd most diagnosed cancer and 2nd leading cause of death in both men and women 53 years of age and older despite being one of the most preventable and treatable cancers if found early. Need Colonoscopy!!! Let us know.   VAGINAL DRYNESS OVERVIEW  Vaginal dryness, also known as atrophic vaginitis, is a common condition in postmenopausal women. This condition is also common in women who have had both ovaries removed at the time of hysterectomy.   Some women have uncomfortable symptoms of vaginal dryness, such as pain with sex, burning vaginal discomfort or itching, or abnormal vaginal discharge, while others have no symptoms at all.  VAGINAL DRYNESS CAUSES   Estrogen helps to keep the vagina moist and to maintain thickness of the vaginal lining. Vaginal dryness occurs when the ovaries produce a decreased amount of estrogen. This can occur at certain times in a woman's life, and may be permanent or temporary. Times when less estrogen is made include: ?At the time of menopause. ?After surgical removal of the ovaries, chemotherapy, or radiation therapy of the pelvis for cancer. ?After having a baby, particularly in women who breastfeed. ?While using certain medications, such as danazol, medroxyprogesterone (brand names: Provera or DepoProvera), leuprolide (brand name: Lupron), or nafarelin. When these medications are stopped, estrogen production resumes.  Women who smoke cigarettes have been shown to have an increased risk of an earlier menopause transition as compared to non-smokers. Therefore, atrophic vaginitis symptoms may appear at a younger age in this population.  VAGINAL DRYNESS TREATMENT   There are three treatment options for women with vaginal dryness:  Vaginal lubricants and moisturizers - Vaginal lubricants and moisturizers can be purchased without a  prescription. These products do not contain any hormones and have virtually no side effects. - Albolene is found in the facial cleanser section at [facility], [facility], or [facility]. It is a large jar with a blue top. This is the best lubricant for women because it is hypoallergenic. -Natural lubricants, such as olive, avocado or peanut oil, are easily available products that may be used as a lubricant with sex.  -Vaginal moisturizes (eg, Replens, Moist Again, Vagisil, K-Y Silk-E, and Feminease) are formulated to allow water to be retained in the vaginal tissues. Moisturizers are applied into the vagina three times weekly to allow a continued moisturizing effect. These should not be used just before having sex, as they can be irritating.  Vaginal estrogen - Vaginal estrogen is the most effective treatment option for women with vaginal dryness. Vaginal estrogen must be prescribed by a healthcare provider. Very low doses of vaginal estrogen can be used when it is put into the vagina to treat vaginal dryness. A small amount of estrogen is absorbed into the bloodstream, but only about 100 times less than when using estrogen pills or tablets. As a result, there is a much lower risk of side effects, such as blood clots, breast cancer, and heart attack, compared with other estrogen-containing products (birth control pills, menopausal hormone therapy).   Ospemifene - Ospemifene is a prescription medication that is similar to estrogen, but is not estrogen. In the vaginal tissue, it acts similarly to estrogen. In the breast tissue, it acts as an estrogen blocker. It comes in a pill, and is prescribed for women who want to use an estrogen-like medication for vaginal dryness or painful sex associated with vaginal dryness, but prefer not  to use a vaginal medication. The medication may cause hot flashes as a side effect. This type of medication may increase the risk of blood clots or uterine cancer. Further study of ospemifene  is needed to evaluate the risk of these complications. This medication has not been tested in women who have had breast cancer or are at a high risk of developing breast cancer.    Sexual activity - Vaginal estrogen improves vaginal dryness quickly, usually within a few weeks. You may continue to have sex as you treat vaginal dryness because sex itself can help to keep the vaginal tissues healthy. Vaginal intercourse may help the vaginal tissues by keeping them soft and stretchable and preventing the tissues from shrinking.  If sex continues to be painful despite treatment for vaginal dryness, talk to your healthcare provider.    We are giving you chantix for smoking cessation. You can do it! And we are here to help! You may have heard some scary side effects about chantix, the three most common I hear about are nausea, crazy dreams and depression.  However, I like for my patients to try to stay on 1/2 a tablet twice a day rather than one tablet twice a day as normally prescribed. This helps decrease the chances of side effects and helps save money by making a one month prescription last two months  Please start the prescription this way:  Start 1/2 tablet by mouth once daily after food with a full glass of water for 3 days Then do 1/2 tablet by mouth twice daily for 4 days.  At this point we have several options: 1) continue on 1/2 tablet twice a day- which I encourage you to do. You can stay on this dose the rest of the time on the medication or if you still feel the need to smoke you can do one of the two options below. 2) do one tablet in the morning and 1/2 in the evening which helps decrease dreams. 3) do one tablet twice a day.   What if I miss a dose? If you miss a dose, take it as soon as you can. If it is almost time for your next dose, take only that dose. Do not take double or extra doses.  What should I watch for while using this medicine? Visit your doctor or health care  professional for regular check ups. Ask for ongoing advice and encouragement from your doctor or healthcare professional, friends, and family to help you quit. If you smoke while on this medication, quit again  Your mouth may get dry. Chewing sugarless gum or hard candy, and drinking plenty of water may help. Contact your doctor if the problem does not go away or is severe.  You may get drowsy or dizzy. Do not drive, use machinery, or do anything that needs mental alertness until you know how this medicine affects you. Do not stand or sit up quickly, especially if you are an older patient.   The use of this medicine may increase the chance of suicidal thoughts or actions. Pay special attention to how you are responding while on this medicine. Any worsening of mood, or thoughts of suicide or dying should be reported to your health care professional right away.  ADVANTAGES OF QUITTING SMOKING  Within 20 minutes, blood pressure decreases. Your pulse is at normal level.  After 8 hours, carbon monoxide levels in the blood return to normal. Your oxygen level increases.  After 24 hours, the chance  of having a heart attack starts to decrease. Your breath, hair, and body stop smelling like smoke.  After 48 hours, damaged nerve endings begin to recover. Your sense of taste and smell improve.  After 72 hours, the body is virtually free of nicotine. Your bronchial tubes relax and breathing becomes easier.  After 2 to 12 weeks, lungs can hold more air. Exercise becomes easier and circulation improves.  After 1 year, the risk of coronary heart disease is cut in half.  After 5 years, the risk of stroke falls to the same as a nonsmoker.  After 10 years, the risk of lung cancer is cut in half and the risk of other cancers decreases significantly.  After 15 years, the risk of coronary heart disease drops, usually to the level of a nonsmoker.  You will have extra money to spend on things other than  cigarettes.

## 2014-05-10 NOTE — Progress Notes (Signed)
Complete Physical  Assessment and Plan: Hypertension-- continue medications, DASH diet, exercise and monitor at home. Call if greater than 130/80.  Depression- currently dealing with some depression from recent trauma but it is normal  Anxiety- take xanax PRN  Allergic rhinitis- Allegra OTC, increase H20, allergy hygiene explained.  Tobacco used 18 years, pack a day- long discussion- willing to try chantix, will wait to start RX, instructions given Get CXR 3D MGM for dense breast PAP next year  Need Colonoscopy  Discussed med's effects and SE's. Screening labs and tests as requested with regular follow-up as recommended.  HPI 53 y.o. female  presents for a complete physical. She has had elevated blood pressure since 2006. Her blood pressure has been controlled at home, today their BP is BP: 110/70 mmHg She does not workout. She denies chest pain, shortness of breath, dizziness.  She is not on cholesterol medication and denies myalgias. Her cholesterol is at goal. The cholesterol last visit was:  LDL 79, HDL 62, Trigs 99 Last A1C in the office was: 5.1  Patient is on Vitamin D supplement.   3-4 weeks ago lost her step daughter to car accident, she had 4 kids, has pretty much raised her.  Her company merged with [employer], still up in the air if she will keep her job. [employer] for 25 years, has 2 kids and 3 grand kids Last Menses 7 months ago, likely menopausal  Current Medications:  Current Outpatient Prescriptions on File Prior to Visit  Medication Sig Dispense Refill  . acyclovir (ZOVIRAX) 400 MG tablet TAKE ONE TABLET 3 TIMES A DAY AS NEEDED.  30 tablet  0  . albuterol (PROVENTIL HFA;VENTOLIN HFA) 108 (90 BASE) MCG/ACT inhaler Inhale 2 puffs into the lungs every 6 (six) hours as needed for wheezing or shortness of breath.  1 Inhaler  2  . ALPRAZolam (XANAX) 0.5 MG tablet Take 1 tablet (0.5 mg total) by mouth 3 (three) times daily as needed for anxiety.  90 tablet  1  . benzonatate  (TESSALON PERLES) 100 MG capsule Take 1 capsule (100 mg total) by mouth 3 (three) times daily as needed.  42 capsule  1  . cetirizine (ZYRTEC) 10 MG tablet Take 10 mg by mouth daily.      . DULoxetine (CYMBALTA) 60 MG capsule TAKE ONE CAPSULE BY MOUTH DAILY  30 capsule  3  . esomeprazole (NEXIUM) 40 MG capsule Take 1 capsule (40 mg total) by mouth daily.  30 capsule  6  . olmesartan-hydrochlorothiazide (BENICAR HCT) 20-12.5 MG per tablet Take 1 tablet by mouth daily.  30 tablet  3  . Vitamin D, Ergocalciferol, (DRISDOL) 50000 UNITS CAPS capsule Take 1 capsule (50,000 Units total) by mouth every Monday, Wednesday, and Friday.  12 capsule  3   No current facility-administered medications on file prior to visit.   Health Maintenance:   Immunization History  Administered Date(s) Administered  . Tdap 05/09/2013   Tetanus: 2014 Pneumovax: Flu vaccine: declines Zostavax: Pap: 04/2012 DUE next year MGM: DUE DEXA: N/A Colonoscopy: NEEDS EGD: Dr. Tonia Brooms Dr. Carlyle Dolly Dr. Donneta Romberg  Allergies:  Allergies  Allergen Reactions  . Azithromycin Nausea And Vomiting   Medical History:  Past Medical History  Diagnosis Date  . Hypertension 2006  . Depression   . Anxiety   . Allergy    Surgical History:  Past Surgical History  Procedure Laterality Date  . Tonsillectomy and adenoidectomy    . Cesarean section      x2  Family History:  Family History  Problem Relation Age of Onset  . Hypertension Father   . Hyperlipidemia Father   . Heart attack Father    Social History:  History  Substance Use Topics  . Smoking status: Current Every Day Smoker -- 1.00 packs/day    Types: Cigarettes  . Smokeless tobacco: Not on file  . Alcohol Use: 3.6 oz/week    6 Cans of beer per week     Comment: nightly for months     Review of Systems: [X]  = complains of  [ ]  = denies  General: Fatigue [ ]  Fever [ ]  Chills [ ]  Weakness [ ]   Insomnia with recent stress [ x]Weight change [ ]  Night sweats [ ]    Change in appetite [ ]  Eyes: Redness [ ]  Blurred vision [ ]  Diplopia [ ]  Discharge [ ]   ENT: Congestion [ ]  Sinus Pain [ ]  Post Nasal Drip [ ]  Sore Throat [ ]  Earache [ ]  hearing loss [ ]  Tinnitus [ ]  Snoring [ ]   Cardiac: Chest pain/pressure [ ]  SOB [ ]  Orthopnea [ ]   Palpitations [ ]   Paroxysmal nocturnal dyspnea[ ]  Claudication [ ]  Edema [ ]   Pulmonary: Cough [ ]  Wheezing[ ]   SOB [ ]   Pleurisy [ ]   GI: Nausea [ ]  Vomiting[ ]  Dysphagia[ ]  Heartburn[ ]  Abdominal pain [ ]  Constipation [ ] ; Diarrhea [ ]  BRBPR [ ]  Melena[ ]  Bloating [ ]  Hemorrhoids [ ]   GU: Hematuria[ ]  Dysuria [ ]  Nocturia[ ]  Urgency [ ]   Hesitancy [ ]  Discharge [ ]  Frequency [ ]   Complains of some vaginal dryness  Breast:  Breast lumps [ x]  nipple discharge [ ]    Neuro: Headaches[ ]  Vertigo[ ]  Paresthesias[ ]  Spasm [ ]  Speech changes [ ]  Incoordination [ ]   Ortho: Arthritis [ ]  Joint pain [ ]  Muscle pain [ ]  Joint swelling [ ]  Back Pain [ ]  Skin:  Rash [ ]   Pruritis [ ]  Change in skin lesion [ ]   Psych: Depression[ ]  Anxiety[x ] Confusion [ ]  Memory loss [ ]   Heme/Lypmh: Bleeding [ ]  Bruising [ ]  Enlarged lymph nodes [ ]   Endocrine: Visual blurring [ ]  Paresthesia [ ]  Polyuria [ ]  Polydypsea [ ]    Heat/cold intolerance [ ]  Hypoglycemia [ ]   Physical Exam: Estimated body mass index is 28.89 kg/(m^2) as calculated from the following:   Height as of this encounter: 5\' 2"  (1.575 m).   Weight as of this encounter: 158 lb (71.668 kg). BP 110/70  Pulse 76  Temp(Src) 97.7 F (36.5 C)  Resp 16  Ht 5\' 2"  (1.575 m)  Wt 158 lb (71.668 kg)  BMI 28.89 kg/m2 General Appearance: Well nourished, in no apparent distress. Eyes: PERRLA, EOMs, conjunctiva no swelling or erythema, normal fundi and vessels. Sinuses: No Frontal/maxillary tenderness ENT/Mouth: Ext aud canals clear, normal light reflex with TMs without erythema, bulging.  Good dentition. No erythema, swelling, or exudate on post pharynx. Tonsils not swollen or erythematous.  Hearing normal.  Neck: Supple, thyroid normal. No bruits Respiratory: Respiratory effort normal, BS equal bilaterally without rales, rhonchi, wheezing or stridor. Cardio: RRR without murmurs, rubs or gallops. Brisk peripheral pulses without edema.  Chest: symmetric, with normal excursions and percussion. Breasts: Symmetric, + FBD, without nipple discharge, retractions. Abdomen: Soft, +BS. Non tender, no guarding, rebound, hernias, masses, or organomegaly. .  Lymphatics: Non tender without lymphadenopathy.  Genitourinary: defer Musculoskeletal: Full ROM all peripheral extremities,5/5 strength, and normal  gait. Skin: Warm, dry without rashes, lesions, ecchymosis.  Neuro: Cranial nerves intact, reflexes equal bilaterally. Normal muscle tone, no cerebellar symptoms. Sensation intact.  Psych: Awake and oriented X 3, normal affect, Insight and Judgment appropriate.   EKG: WNL no changes. AORTA SCAN: WNL    Vicie Mutters 9:19 AM

## 2014-05-11 LAB — URINALYSIS, ROUTINE W REFLEX MICROSCOPIC
Bilirubin Urine: NEGATIVE
Glucose, UA: NEGATIVE mg/dL
HGB URINE DIPSTICK: NEGATIVE
KETONES UR: NEGATIVE mg/dL
Leukocytes, UA: NEGATIVE
NITRITE: NEGATIVE
PROTEIN: NEGATIVE mg/dL
Specific Gravity, Urine: <1.005 — ABNORMAL LOW (ref 1.005–1.030)
UROBILINOGEN UA: 0.2 mg/dL (ref 0.0–1.0)
pH: 7 (ref 5.0–8.0)

## 2014-05-11 LAB — LIPID PANEL
Cholesterol: 171 mg/dL (ref 0–200)
HDL: 65 mg/dL (ref >39)
LDL Cholesterol: 94 mg/dL (ref 0–99)
Total CHOL/HDL Ratio: 2.6 Ratio
Triglycerides: 58 mg/dL (ref <150)
VLDL: 12 mg/dL (ref 0–40)

## 2014-05-11 LAB — BASIC METABOLIC PANEL WITH GFR
BUN: 10 mg/dL (ref 6–23)
CO2: 28 mEq/L (ref 19–32)
CREATININE: 0.62 mg/dL (ref 0.50–1.10)
Calcium: 9 mg/dL (ref 8.4–10.5)
Chloride: 100 mEq/L (ref 96–112)
GFR, Est African American: >89 mL/min
Glucose, Bld: 87 mg/dL (ref 70–99)
Potassium: 4.2 mEq/L (ref 3.5–5.3)
Sodium: 138 mEq/L (ref 135–145)

## 2014-05-11 LAB — IRON AND TIBC
%SAT: 21 % (ref 20–55)
Iron: 79 ug/dL (ref 42–145)
TIBC: 369 ug/dL (ref 250–470)
UIBC: 290 ug/dL (ref 125–400)

## 2014-05-11 LAB — FOLLICLE STIMULATING HORMONE: FSH: 72.7 m[IU]/mL

## 2014-05-11 LAB — HEPATIC FUNCTION PANEL
ALBUMIN: 4 g/dL (ref 3.5–5.2)
ALT: 10 U/L (ref 0–35)
AST: 15 U/L (ref 0–37)
Alkaline Phosphatase: 71 U/L (ref 39–117)
Bilirubin, Direct: 0.1 mg/dL (ref 0.0–0.3)
Indirect Bilirubin: 0.2 mg/dL (ref 0.2–1.2)
TOTAL PROTEIN: 6.4 g/dL (ref 6.0–8.3)
Total Bilirubin: 0.3 mg/dL (ref 0.2–1.2)

## 2014-05-11 LAB — MAGNESIUM: MAGNESIUM: 1.8 mg/dL (ref 1.5–2.5)

## 2014-05-11 LAB — VITAMIN D 25 HYDROXY (VIT D DEFICIENCY, FRACTURES): VIT D 25 HYDROXY: 93 ng/mL — AB (ref 30–89)

## 2014-05-11 LAB — INSULIN, FASTING: INSULIN FASTING, SERUM: 12 u[IU]/mL (ref 3–28)

## 2014-05-11 LAB — MICROALBUMIN / CREATININE URINE RATIO
CREATININE, URINE: 22.5 mg/dL
MICROALB UR: 0.5 mg/dL (ref 0.00–1.89)
MICROALB/CREAT RATIO: 22.2 mg/g (ref 0.0–30.0)

## 2014-05-11 LAB — TSH: TSH: 1.641 u[IU]/mL (ref 0.350–4.500)

## 2014-05-11 LAB — FERRITIN: Ferritin: 22 ng/mL (ref 10–291)

## 2014-05-11 LAB — VITAMIN B12: Vitamin B-12: 652 pg/mL (ref 211–911)

## 2014-05-15 ENCOUNTER — Encounter: Payer: Self-pay | Admitting: *Deleted

## 2014-05-19 ENCOUNTER — Other Ambulatory Visit: Payer: Self-pay

## 2014-05-19 ENCOUNTER — Other Ambulatory Visit: Payer: Self-pay | Admitting: Physician Assistant

## 2014-05-19 MED ORDER — ALPRAZOLAM 0.5 MG PO TABS: 0.5000 mg | ORAL_TABLET | Freq: Three times a day (TID) | ORAL | Status: DC | PRN

## 2014-05-19 MED ORDER — OLMESARTAN MEDOXOMIL-HCTZ 20-12.5 MG PO TABS: 1.0000 | ORAL_TABLET | Freq: Every day | ORAL | Status: DC

## 2014-07-05 ENCOUNTER — Other Ambulatory Visit: Payer: Self-pay

## 2014-07-05 MED ORDER — DULOXETINE HCL 60 MG PO CPEP: 60.0000 mg | ORAL_CAPSULE | Freq: Every day | ORAL | Status: DC

## 2014-08-01 ENCOUNTER — Other Ambulatory Visit: Payer: Self-pay | Admitting: Physician Assistant

## 2014-08-01 MED ORDER — ALPRAZOLAM 0.5 MG PO TABS: 0.5000 mg | ORAL_TABLET | Freq: Three times a day (TID) | ORAL | Status: DC | PRN

## 2014-08-21 ENCOUNTER — Encounter: Payer: Self-pay | Admitting: Physician Assistant

## 2014-08-21 ENCOUNTER — Ambulatory Visit (INDEPENDENT_AMBULATORY_CARE_PROVIDER_SITE_OTHER): Payer: 59 | Admitting: Physician Assistant

## 2014-08-21 VITALS — BP 118/70 | HR 80 | Temp 98.2°F | Resp 16 | Ht 62.0 in | Wt 161.0 lb

## 2014-08-21 DIAGNOSIS — R05 Cough: Secondary | ICD-10-CM

## 2014-08-21 DIAGNOSIS — J0100 Acute maxillary sinusitis, unspecified: Secondary | ICD-10-CM

## 2014-08-21 DIAGNOSIS — R059 Cough, unspecified: Secondary | ICD-10-CM

## 2014-08-21 MED ORDER — AMOXICILLIN-POT CLAVULANATE 875-125 MG PO TABS
1.0000 | ORAL_TABLET | Freq: Two times a day (BID) | ORAL | Status: DC
Start: 2014-08-21 — End: 2014-11-16

## 2014-08-21 MED ORDER — PROMETHAZINE-DM 6.25-15 MG/5ML PO SYRP: 5.0000 mL | ORAL_SOLUTION | Freq: Four times a day (QID) | ORAL | Status: DC | PRN

## 2014-08-21 NOTE — Patient Instructions (Addendum)
- Take Augmentin and Promethazine-DM as prescribed.   -Please take Tylenol or Ibuprofen for pain. -Acetaminiphen 325mg  orally every 4-6 hours for pain.  Max: 10 per day -Ibuprofen 200mg  orally every 6-8 hours for pain.  Take with food to avoid ulcers.   Max 10 per day -Continue Albuterol as prescribed. Try steam showers to open your nasal passages.  Drink lots of water to stay hydrated and to thin mucous. -Take 2 sprays in each nostril at bedtime.  Make sure you spray towards the outside of each nostril, hold nose close and tilt head back.  This will help the medication get into your sinuses.  If you do not like this medication, then use saline nasal sprays same directions as above for Flonase.  -It can take up to 2 weeks to feel better.  Sinusitis is mostly caused by viruses.   Sinusitis Sinusitis is redness, soreness, and inflammation of the paranasal sinuses. Paranasal sinuses are air pockets within the bones of your face (beneath the eyes, the middle of the forehead, or above the eyes). In healthy paranasal sinuses, mucus is able to drain out, and air is able to circulate through them by way of your nose. However, when your paranasal sinuses are inflamed, mucus and air can become trapped. This can allow bacteria and other germs to grow and cause infection. Sinusitis can develop quickly and last only a short time (acute) or continue over a long period (chronic). Sinusitis that lasts for more than 12 weeks is considered chronic.  CAUSES  Causes of sinusitis include:  Allergies.  Structural abnormalities, such as displacement of the cartilage that separates your nostrils (deviated septum), which can decrease the air flow through your nose and sinuses and affect sinus drainage.  Functional abnormalities, such as when the small hairs (cilia) that line your sinuses and help remove mucus do not work properly or are not present. SIGNS AND SYMPTOMS  Symptoms of acute and chronic sinusitis are the  same. The primary symptoms are pain and pressure around the affected sinuses. Other symptoms include:  Upper toothache.  Earache.  Headache.  Bad breath.  Decreased sense of smell and taste.  A cough, which worsens when you are lying flat.  Fatigue.  Fever.  Thick drainage from your nose, which often is green and may contain pus (purulent).  Swelling and warmth over the affected sinuses. DIAGNOSIS  Your health care provider will perform a physical exam. During the exam, your health care provider may:  Look in your nose for signs of abnormal growths in your nostrils (nasal polyps).  Tap over the affected sinus to check for signs of infection.  View the inside of your sinuses (endoscopy) using an imaging device that has a light attached (endoscope). If your health care provider suspects that you have chronic sinusitis, one or more of the following tests may be recommended:  Allergy tests.  Nasal culture. A sample of mucus is taken from your nose, sent to a lab, and screened for bacteria.  Nasal cytology. A sample of mucus is taken from your nose and examined by your health care provider to determine if your sinusitis is related to an allergy. TREATMENT  Most cases of acute sinusitis are related to a viral infection and will resolve on their own within 10 days. Sometimes medicines are prescribed to help relieve symptoms (pain medicine, decongestants, nasal steroid sprays, or saline sprays).  However, for sinusitis related to a bacterial infection, your health care provider will prescribe antibiotic medicines.  These are medicines that will help kill the bacteria causing the infection.  Rarely, sinusitis is caused by a fungal infection. In theses cases, your health care provider will prescribe antifungal medicine. For some cases of chronic sinusitis, surgery is needed. Generally, these are cases in which sinusitis recurs more than 3 times per year, despite other treatments. HOME  CARE INSTRUCTIONS   Drink plenty of water. Water helps thin the mucus so your sinuses can drain more easily.  Use a humidifier.  Inhale steam 3 to 4 times a day (for example, sit in the bathroom with the shower running).  Apply a warm, moist washcloth to your face 3 to 4 times a day, or as directed by your health care provider.  Use saline nasal sprays to help moisten and clean your sinuses.  Take medicines only as directed by your health care provider.  If you were prescribed either an antibiotic or antifungal medicine, finish it all even if you start to feel better. SEEK IMMEDIATE MEDICAL CARE IF:  You have increasing pain or severe headaches.  You have nausea, vomiting, or drowsiness.  You have swelling around your face.  You have vision problems.  You have a stiff neck.  You have difficulty breathing. MAKE SURE YOU:   Understand these instructions.  Will watch your condition.  Will get help right away if you are not doing well or get worse. Document Released: 10/13/2005 Document Revised: 02/27/2014 Document Reviewed: 10/28/2011 [facility] Patient Information 2015 [place], [place]. This information is not intended to replace advice given to you by your health care provider. Make sure you discuss any questions you have with your health care provider.

## 2014-08-21 NOTE — Progress Notes (Signed)
Subjective:    Patient ID: Sylvia Mclean, female    DOB: 03/13/1961, 53 y.o.   MRN: [id number]  53 y.o. female presents today with sinus problems.  Sinus Problem This is a new problem. Episode onset: Saturday 10/24. The problem is unchanged. There has been no fever. Associated symptoms include congestion, coughing, headaches, sinus pressure and a sore throat. Pertinent negatives include no chills, ear pain, hoarse voice, neck pain, shortness of breath, sneezing or swollen glands. (Rhinorhea and Wheezing per pt.  Patient had bilateral tonsillectomy in childhood.) Treatments tried: Tried Advil, Zyrtec and Flonase with no relief.   Review of Systems  Constitutional: Positive for fatigue. Negative for fever, chills, activity change and appetite change.  HENT: Positive for congestion, postnasal drip, rhinorrhea, sinus pressure and sore throat. Negative for dental problem, ear discharge, ear pain, hoarse voice and sneezing.   Eyes: Negative.   Respiratory: Positive for cough and wheezing. Negative for chest tightness and shortness of breath.   Cardiovascular: Negative.   Gastrointestinal: Negative.   Musculoskeletal: Negative for neck pain.  Skin: Negative for rash.  Neurological: Positive for headaches. Negative for dizziness.  Psychiatric/Behavioral: Negative.    Current Outpatient Prescriptions on File Prior to Visit  Medication Sig Dispense Refill  . acyclovir (ZOVIRAX) 400 MG tablet TAKE ONE TABLET 3 TIMES A DAY AS NEEDED.  30 tablet  0  . albuterol (PROVENTIL HFA;VENTOLIN HFA) 108 (90 BASE) MCG/ACT inhaler Inhale 2 puffs into the lungs every 6 (six) hours as needed for wheezing or shortness of breath.  1 Inhaler  2  . ALPRAZolam (XANAX) 0.5 MG tablet Take 1 tablet (0.5 mg total) by mouth 3 (three) times daily as needed for anxiety.  90 tablet  1  . cetirizine (ZYRTEC) 10 MG tablet Take 10 mg by mouth daily.      . DULoxetine (CYMBALTA) 60 MG capsule Take 1 capsule (60 mg total) by mouth  daily.  30 capsule  3  . esomeprazole (NEXIUM) 40 MG capsule Take 1 capsule (40 mg total) by mouth daily.  30 capsule  6  . olmesartan-hydrochlorothiazide (BENICAR HCT) 20-12.5 MG per tablet Take 1 tablet by mouth daily.  30 tablet  3  . varenicline (CHANTIX) 1 MG tablet Take 1 tablet (1 mg total) by mouth 2 (two) times daily.  56 tablet  2  . Vitamin D, Ergocalciferol, (DRISDOL) 50000 UNITS CAPS capsule Take 1 capsule (50,000 Units total) by mouth every Monday, Wednesday, and Friday.  12 capsule  3   No current facility-administered medications on file prior to visit.   Allergies  Allergen Reactions  . Azithromycin Nausea And Vomiting   Past Medical History  Diagnosis Date  . Hypertension 2006  . Depression   . Anxiety   . Allergy       Objective:   Physical Exam  Vitals reviewed. Constitutional: She is oriented to person, place, and time. She appears well-developed and well-nourished. She is cooperative. She has a sickly appearance.  HENT:  Head: Normocephalic.  Right Ear: Tympanic membrane, external ear and ear canal normal. No drainage, swelling or tenderness. Tympanic membrane is not erythematous, not retracted and not bulging. No middle ear effusion.  Left Ear: Tympanic membrane, external ear and ear canal normal. No drainage, swelling or tenderness. Tympanic membrane is not erythematous, not retracted and not bulging.  No middle ear effusion.  Nose: Mucosal edema and rhinorrhea present. Right sinus exhibits maxillary sinus tenderness and frontal sinus tenderness. Left sinus exhibits maxillary sinus  tenderness and frontal sinus tenderness.  Mouth/Throat: Uvula is midline and mucous membranes are normal. Posterior oropharyngeal erythema present. No oropharyngeal exudate or posterior oropharyngeal edema.  Bilateral tonsillectomy in childhood.  Eyes: Conjunctivae, EOM and lids are normal. Right eye exhibits no discharge. Left eye exhibits no discharge. No scleral icterus.  Neck:  Trachea normal and normal range of motion. Neck supple. No tracheal tenderness present. No tracheal deviation present.  Cardiovascular: Normal rate, regular rhythm, S1 normal, S2 normal, normal heart sounds and normal pulses.  PMI is not displaced.  Exam reveals no gallop, no distant heart sounds and no friction rub.   No murmur heard. Pulmonary/Chest: Effort normal and breath sounds normal. No stridor. No respiratory distress. She has no decreased breath sounds. She has no wheezes. She has no rhonchi. She has no rales. She exhibits no tenderness.  Musculoskeletal: Normal range of motion.  Lymphadenopathy:    She has no cervical adenopathy.  Neurological: She is alert and oriented to person, place, and time.  Skin: Skin is warm, dry and intact. No rash noted.  Psychiatric: She has a normal mood and affect. Her speech is normal and behavior is normal.   BP 118/70  Pulse 80  Temp(Src) 98.2 F (36.8 C)  Resp 16  Ht 5\' 2"  (1.575 m)  Wt 161 lb (73.029 kg)  BMI 29.44 kg/m2     Assessment & Plan:  1. Acute maxillary sinusitis, recurrence not specified Take Augmentin as prescribed for 7 days.  Was just on Amoxicillin in June 2015 and is current smoker. Take Promethazine-DM as prescribed.    Pharmacy called at 1412 today and said they are out of Promethazine-DM.  Changed medication to Promethazine with phenylephrine with the same dosing of  Take 28mL PO four times daily PRN for cough and nasal congestion. - amoxicillin-clavulanate (AUGMENTIN) 875-125 MG per tablet; Take 1 tablet by mouth 2 (two) times daily. 7 days  Dispense: 14 tablet; Refill: 0 - promethazine-dextromethorphan (PROMETHAZINE-DM) 6.25-15 MG/5ML syrup; Take 5 mLs by mouth 4 (four) times daily as needed for cough.  Dispense: 180 mL; Refill: 0-  Promethazine- DM discontinued due to pharmacy being out of stock.  Switched to Promethazine with Phenylephrine. -Please take Tylenol or Ibuprofen for pain.  -Acetaminiphen 325mg  orally every  4-6 hours for pain.  Max: 10 per day  -Ibuprofen 200mg  orally every 6-8 hours for pain.  Take with food to avoid ulcers.    Max 10 per day -Try steam showers to open your nasal passages.  Drink lots of water to stay hydrated and to thin mucous. -Take 2 sprays in each nostril at bedtime.  Make sure you spray towards the outside of each nostril, hold nose close and tilt head back.  This will help the medication get into your sinuses.  If you do not like this medication, then use saline nasal sprays same directions as above for Flonase.  -It can take up to 2 weeks to feel better.  Sinusitis is mostly caused by viruses, but take antibiotic especially with smoking history.  2. Cough Take Promethazine-DM as prescribed.    Pharmacy called at 1412 today and said they are out of Promethazine-DM.  Changed medication to Promethazine with phenylephrine with the same dosing of  Take 82mL PO four times daily PRN for cough and nasal congestion.  - Continue Albuterol as prescribed. - You are doing a great job on cutting down on cigarettes.  If you need help quitting, then we can talk about that at  your next visit. -Please call office if not feeling better in 10-14 days.   Sinusitis Sinusitis is redness, soreness, and inflammation of the paranasal sinuses. Paranasal sinuses are air pockets within the bones of your face (beneath the eyes, the middle of the forehead, or above the eyes). In healthy paranasal sinuses, mucus is able to drain out, and air is able to circulate through them by way of your nose. However, when your paranasal sinuses are inflamed, mucus and air can become trapped. This can allow bacteria and other germs to grow and cause infection. Sinusitis can develop quickly and last only a short time (acute) or continue over a long period (chronic). Sinusitis that lasts for more than 12 weeks is considered chronic.  CAUSES  Causes of sinusitis include:  Allergies.  Structural abnormalities, such as  displacement of the cartilage that separates your nostrils (deviated septum), which can decrease the air flow through your nose and sinuses and affect sinus drainage.  Functional abnormalities, such as when the small hairs (cilia) that line your sinuses and help remove mucus do not work properly or are not present. SIGNS AND SYMPTOMS  Symptoms of acute and chronic sinusitis are the same. The primary symptoms are pain and pressure around the affected sinuses. Other symptoms include:  Upper toothache.  Earache.  Headache.  Bad breath.  Decreased sense of smell and taste.  A cough, which worsens when you are lying flat.  Fatigue.  Fever.  Thick drainage from your nose, which often is green and may contain pus (purulent).  Swelling and warmth over the affected sinuses. DIAGNOSIS  Your health care provider will perform a physical exam. During the exam, your health care provider may:  Look in your nose for signs of abnormal growths in your nostrils (nasal polyps).  Tap over the affected sinus to check for signs of infection.  View the inside of your sinuses (endoscopy) using an imaging device that has a light attached (endoscope). If your health care provider suspects that you have chronic sinusitis, one or more of the following tests may be recommended:  Allergy tests.  Nasal culture. A sample of mucus is taken from your nose, sent to a lab, and screened for bacteria.  Nasal cytology. A sample of mucus is taken from your nose and examined by your health care provider to determine if your sinusitis is related to an allergy. TREATMENT  Most cases of acute sinusitis are related to a viral infection and will resolve on their own within 10 days. Sometimes medicines are prescribed to help relieve symptoms (pain medicine, decongestants, nasal steroid sprays, or saline sprays).  However, for sinusitis related to a bacterial infection, your health care provider will prescribe antibiotic  medicines. These are medicines that will help kill the bacteria causing the infection.  Rarely, sinusitis is caused by a fungal infection. In theses cases, your health care provider will prescribe antifungal medicine. For some cases of chronic sinusitis, surgery is needed. Generally, these are cases in which sinusitis recurs more than 3 times per year, despite other treatments. HOME CARE INSTRUCTIONS   Drink plenty of water. Water helps thin the mucus so your sinuses can drain more easily.  Use a humidifier.  Inhale steam 3 to 4 times a day (for example, sit in the bathroom with the shower running).  Apply a warm, moist washcloth to your face 3 to 4 times a day, or as directed by your health care provider.  Use saline nasal sprays to help moisten  and clean your sinuses.  Take medicines only as directed by your health care provider.  If you were prescribed either an antibiotic or antifungal medicine, finish it all even if you start to feel better. SEEK IMMEDIATE MEDICAL CARE IF:  You have increasing pain or severe headaches.  You have nausea, vomiting, or drowsiness.  You have swelling around your face.  You have vision problems.  You have a stiff neck.  You have difficulty breathing. MAKE SURE YOU:   Understand these instructions.  Will watch your condition.  Will get help right away if you are not doing well or get worse. Document Released: 10/13/2005 Document Revised: 02/27/2014 Document Reviewed: 10/28/2011 [facility] Patient Information 2015 [place], [place]. This information is not intended to replace advice given to you by your health care provider. Make sure you discuss any questions you have with your health care provider.

## 2014-09-26 ENCOUNTER — Other Ambulatory Visit: Payer: Self-pay

## 2014-09-26 MED ORDER — OLMESARTAN MEDOXOMIL-HCTZ 20-12.5 MG PO TABS: 1.0000 | ORAL_TABLET | Freq: Every day | ORAL | Status: DC

## 2014-10-11 ENCOUNTER — Other Ambulatory Visit: Payer: Self-pay | Admitting: Physician Assistant

## 2014-10-11 MED ORDER — ALPRAZOLAM 0.5 MG PO TABS: 0.5000 mg | ORAL_TABLET | Freq: Three times a day (TID) | ORAL | Status: DC | PRN

## 2014-11-16 ENCOUNTER — Encounter: Payer: Self-pay | Admitting: Physician Assistant

## 2014-11-16 ENCOUNTER — Ambulatory Visit (INDEPENDENT_AMBULATORY_CARE_PROVIDER_SITE_OTHER): Payer: 59 | Admitting: Physician Assistant

## 2014-11-16 VITALS — BP 122/78 | HR 76 | Temp 98.6°F | Resp 16 | Ht 62.0 in | Wt 157.0 lb

## 2014-11-16 DIAGNOSIS — F329 Major depressive disorder, single episode, unspecified: Secondary | ICD-10-CM

## 2014-11-16 DIAGNOSIS — T7840XD Allergy, unspecified, subsequent encounter: Secondary | ICD-10-CM

## 2014-11-16 DIAGNOSIS — F32A Depression, unspecified: Secondary | ICD-10-CM

## 2014-11-16 DIAGNOSIS — I10 Essential (primary) hypertension: Secondary | ICD-10-CM

## 2014-11-16 DIAGNOSIS — F419 Anxiety disorder, unspecified: Secondary | ICD-10-CM

## 2014-11-16 MED ORDER — VARENICLINE TARTRATE 1 MG PO TABS: 1.0000 mg | ORAL_TABLET | Freq: Two times a day (BID) | ORAL | Status: DC

## 2014-11-16 NOTE — Patient Instructions (Addendum)
Please get your MAMMOGRAM and CHEST XRAY!!  We are giving you chantix for smoking cessation. You can do it! And we are here to help! You may have heard some scary side effects about chantix, the three most common I hear about are nausea, crazy dreams and depression.  However, I like for my patients to try to stay on 1/2 a tablet twice a day rather than one tablet twice a day as normally prescribed. This helps decrease the chances of side effects and helps save money by making a one month prescription last two months  Please start the prescription this way:  Start 1/2 tablet by mouth once daily after food with a full glass of water for 3 days Then do 1/2 tablet by mouth twice daily for 4 days.  At this point we have several options: 1) continue on 1/2 tablet twice a day- which I encourage you to do. You can stay on this dose the rest of the time on the medication or if you still feel the need to smoke you can do one of the two options below. 2) do one tablet in the morning and 1/2 in the evening which helps decrease dreams. 3) do one tablet twice a day.   What if I miss a dose? If you miss a dose, take it as soon as you can. If it is almost time for your next dose, take only that dose. Do not take double or extra doses.  What should I watch for while using this medicine? Visit your doctor or health care professional for regular check ups. Ask for ongoing advice and encouragement from your doctor or healthcare professional, friends, and family to help you quit. If you smoke while on this medication, quit again  Your mouth may get dry. Chewing sugarless gum or hard candy, and drinking plenty of water may help. Contact your doctor if the problem does not go away or is severe.  You may get drowsy or dizzy. Do not drive, use machinery, or do anything that needs mental alertness until you know how this medicine affects you. Do not stand or sit up quickly, especially if you are an older patient.    The use of this medicine may increase the chance of suicidal thoughts or actions. Pay special attention to how you are responding while on this medicine. Any worsening of mood, or thoughts of suicide or dying should be reported to your health care professional right away.  ADVANTAGES OF QUITTING SMOKING 1. Within 20 minutes, blood pressure decreases. Your pulse is at normal level. 2. After 8 hours, carbon monoxide levels in the blood return to normal. Your oxygen level increases. 3. After 24 hours, the chance of having a heart attack starts to decrease. Your breath, hair, and body stop smelling like smoke. 4. After 48 hours, damaged nerve endings begin to recover. Your sense of taste and smell improve. 5. After 72 hours, the body is virtually free of nicotine. Your bronchial tubes relax and breathing becomes easier. 6. After 2 to 12 weeks, lungs can hold more air. Exercise becomes easier and circulation improves. 7. After 1 year, the risk of coronary heart disease is cut in half. 8. After 5 years, the risk of stroke falls to the same as a nonsmoker. 9. After 10 years, the risk of lung cancer is cut in half and the risk of other cancers decreases significantly. 10. After 15 years, the risk of coronary heart disease drops, usually to the level of  a nonsmoker. 11. You will have extra money to spend on things other than cigarettes.  Before you even begin to attack a weight-loss plan, it pays to remember this: You are not fat. You have fat. Losing weight isn't about blame or shame; it's simply another achievement to accomplish. Dieting is like any other skill-you have to buckle down and work at it. As long as you act in a smart, reasonable way, you'll ultimately get where you want to be. Here are some weight loss pearls for you.  1. It's Not a Diet. It's a Lifestyle Thinking of a diet as something you're on and suffering through only for the short term doesn't work. To shed weight and keep it off,  you need to make permanent changes to the way you eat. It's OK to indulge occasionally, of course, but if you cut calories temporarily and then revert to your old way of eating, you'll gain back the weight quicker than you can say yo-yo. Use it to lose it. Research shows that one of the best predictors of long-term weight loss is how many pounds you drop in the first month. For that reason, nutritionists often suggest being stricter for the first two weeks of your new eating strategy to build momentum. Cut out added sugar and alcohol and avoid unrefined carbs. After that, figure out how you can reincorporate them in a way that's healthy and maintainable.  2. There's a Right Way to Exercise Working out burns calories and fat and boosts your metabolism by building muscle. But those trying to lose weight are notorious for overestimating the number of calories they burn and underestimating the amount they take in. Unfortunately, your system is biologically programmed to hold on to extra pounds and that means when you start exercising, your body senses the deficit and ramps up its hunger signals. If you're not diligent, you'll eat everything you burn and then some. Use it to lose it. Cardio gets all the exercise glory, but strength and interval training are the real heroes. They help you build lean muscle, which in turn increases your metabolism and calorie-burning ability 3. Don't Overreact to Mild Hunger Some people have a hard time losing weight because of hunger anxiety. To them, being hungry is bad-something to be avoided at all costs-so they carry snacks with them and eat when they don't need to. Others eat because they're stressed out or bored. While you never want to get to the point of being ravenous (that's when bingeing is likely to happen), a hunger pang, a craving, or the fact that it's 3:00 p.m. should not send you racing for the vending machine or obsessing about the energy bar in your purse.  Ideally, you should put off eating until your stomach is growling and it's difficult to concentrate.  Use it to lose it. When you feel the urge to eat, use the HALT method. Ask yourself, Am I really hungry? Or am I angry or anxious, lonely or bored, or tired? If you're still not certain, try the apple test. If you're truly hungry, an apple should seem delicious; if it doesn't, something else is going on. Or you can try drinking water and making yourself busy, if you are still hungry try a healthy snack.  4. Not All Calories Are Created Equal The mechanics of weight loss are pretty simple: Take in fewer calories than you use for energy. But the kind of food you eat makes all the difference. Processed food that's high in  saturated fat and refined starch or sugar can cause inflammation that disrupts the hormone signals that tell your brain you're full. The result: You eat a lot more.  Use it to lose it. Clean up your diet. Swap in whole, unprocessed foods, including vegetables, lean protein, and healthy fats that will fill you up and give you the biggest nutritional bang for your calorie buck. In a few weeks, as your brain starts receiving regular hunger and fullness signals once again, you'll notice that you feel less hungry overall and naturally start cutting back on the amount you eat.  5. Protein, Produce, and Plant-Based Fats Are Your Weight-Loss Trinity Here's why eating the three Ps regularly will help you drop pounds. Protein fills you up. You need it to build lean muscle, which keeps your metabolism humming so that you can torch more fat. People in a weight-loss program who ate double the recommended daily allowance for protein (about 110 grams for a 150-pound woman) lost 70 percent of their weight from fat, while people who ate the RDA lost only about 40 percent, one study found. Produce is packed with filling fiber. "It's very difficult to consume too many calories if you're eating a lot of  vegetables. Example: Three cups of broccoli is a lot of food, yet only 93 calories. (Fruit is another story. It can be easy to overeat and can contain a lot of calories from sugar, so be sure to monitor your intake.) Plant-based fats like olive oil and those in avocados and nuts are healthy and extra satiating.  Use it to lose it. Aim to incorporate each of the three Ps into every meal and snack. People who eat protein throughout the day are able to keep weight off, according to a study in the [place] of Clinical Nutrition. In addition to meat, poultry and seafood, good sources are beans, lentils, eggs, tofu, and yogurt. As for fat, keep portion sizes in check by measuring out salad dressing, oil, and nut butters (shoot for one to two tablespoons). Finally, eat veggies or a little fruit at every meal. People who did that consumed 308 fewer calories but didn't feel any hungrier than when they didn't eat more produce.  7. How You Eat Is As Important As What You Eat In order for your brain to register that you're full, you need to focus on what you're eating. Sit down whenever you eat, preferably at a table. Turn off the TV or computer, put down your phone, and look at your food. Smell it. Chew slowly, and don't put another bite on your fork until you swallow. When women ate lunch this attentively, they consumed 30 percent less when snacking later than those who listened to an audiobook at lunchtime, according to a study in the [place] of Nutrition. 8. Weighing Yourself Really Works The scale provides the best evidence about whether your efforts are paying off. Seeing the numbers tick up or down or stagnate is motivation to keep going-or to rethink your approach. A 2015 study at [facility] found that daily weigh-ins helped people lose more weight, keep it off, and maintain that loss, even after two years. Use it to lose it. Step on the scale at the same time every day for the best  results. If your weight shoots up several pounds from one weigh-in to the next, don't freak out. Eating a lot of salt the night before or having your period is the likely culprit. The number should return to  normal in a day or two. It's a steady climb that you need to do something about. 9. Too Much Stress and Too Little Sleep Are Your Enemies When you're tired and frazzled, your body cranks up the production of cortisol, the stress hormone that can cause carb cravings. Not getting enough sleep also boosts your levels of ghrelin, a hormone associated with hunger, while suppressing leptin, a hormone that signals fullness and satiety. People on a diet who slept only five and a half hours a night for two weeks lost 55 percent less fat and were hungrier than those who slept eight and a half hours, according to a study in the [place]. Use it to lose it. Prioritize sleep, aiming for seven hours or more a night, which research shows helps lower stress. And make sure you're getting quality zzz's. If a snoring spouse or a fidgety cat wakes you up frequently throughout the night, you may end up getting the equivalent of just four hours of sleep, according to a study from [facility]. Keep pets out of the bedroom, and use a white-noise app to drown out snoring. 10. You Will Hit a plateau-And You Can Bust Through It As you slim down, your body releases much less leptin, the fullness hormone.  If you're not strength training, start right now. Building muscle can raise your metabolism to help you overcome a plateau. To keep your body challenged and burning calories, incorporate new moves and more intense intervals into your workouts or add another sweat session to your weekly routine. Alternatively, cut an extra 100 calories or so a day from your diet. Now that you've lost weight, your body simply doesn't need as much fuel.      Bad carbs also include fruit juice, alcohol, and sweet  tea. These are empty calories that do not signal to your brain that you are full.   Please remember the good carbs are still carbs which convert into sugar. So please measure them out no more than 1/2-1 cup of rice, oatmeal, pasta, and beans  Veggies are however free foods! Pile them on.   Not all fruit is created equal. Please see the list below, the fruit at the bottom is higher in sugars than the fruit at the top. Please avoid all dried fruits.

## 2014-11-16 NOTE — Progress Notes (Signed)
Assessment and Plan:  Hypertension: Continue medication, monitor blood pressure at home. Continue DASH diet.  Reminder to go to the ER if any CP, SOB, nausea, dizziness, severe HA, changes vision/speech, left arm numbness and tingling, and jaw pain. Cholesterol: Continue diet and exercise. Check cholesterol.  Vitamin D Def- check level and continue medications.  Smoking cessation-  counseled patient on the dangers of tobacco use, advised patient to stop smoking, and reviewed strategies to maximize success, continue to try chantix, patient is willing   Continue diet and meds as discussed. Will get labs in July.   HPI 54 y.o. female  presents for 3 month follow up with hypertension, hyperlipidemia, prediabetes and vitamin D. Her blood pressure has been controlled at home, today their BP is BP: 122/78 mmHg She does workout but sporadic She denies chest pain, shortness of breath, dizziness.  She is not on cholesterol medication and denies myalgias. Her cholesterol is at goal. The cholesterol last visit was:   Lab Results  Component Value Date   CHOL 171 05/10/2014   HDL 65 05/10/2014   LDLCALC 94 05/10/2014   TRIG 58 05/10/2014   CHOLHDL 2.6 05/10/2014  She has been working on diet and exercise for prediabetes, and denies paresthesia of the feet, polydipsia, polyuria and visual disturbances. Last A1C in the office was:  Lab Results  Component Value Date   HGBA1C 5.6 05/10/2014  Patient is on Vitamin D supplement, taking it 3 days a week will start to take it 2 days a week.   Lab Results  Component Value Date   VD25OH 4* 05/10/2014   First holiday without her step daughter who passed and her birthday was in November so it was hard on her and her husband.  She has not had a menses for 1 year.  She has did not get her CXR, MGM or colonoscopy. He continues to smoke and is not interested in quitting, she was give chantix but never started due to fear of AEs.  She is on cymbalta and xanax  for depression and xaanx   Current Medications:  Current Outpatient Prescriptions on File Prior to Visit  Medication Sig Dispense Refill  . acyclovir (ZOVIRAX) 400 MG tablet TAKE ONE TABLET 3 TIMES A DAY AS NEEDED. 30 tablet 0  . albuterol (PROVENTIL HFA;VENTOLIN HFA) 108 (90 BASE) MCG/ACT inhaler Inhale 2 puffs into the lungs every 6 (six) hours as needed for wheezing or shortness of breath. 1 Inhaler 2  . ALPRAZolam (XANAX) 0.5 MG tablet Take 1 tablet (0.5 mg total) by mouth 3 (three) times daily as needed for anxiety. 90 tablet 1  . amoxicillin-clavulanate (AUGMENTIN) 875-125 MG per tablet Take 1 tablet by mouth 2 (two) times daily. 7 days 14 tablet 0  . cetirizine (ZYRTEC) 10 MG tablet Take 10 mg by mouth daily.    . DULoxetine (CYMBALTA) 60 MG capsule Take 1 capsule (60 mg total) by mouth daily. 30 capsule 3  . esomeprazole (NEXIUM) 40 MG capsule Take 1 capsule (40 mg total) by mouth daily. 30 capsule 6  . olmesartan-hydrochlorothiazide (BENICAR HCT) 20-12.5 MG per tablet Take 1 tablet by mouth daily. 30 tablet 3  . varenicline (CHANTIX) 1 MG tablet Take 1 tablet (1 mg total) by mouth 2 (two) times daily. 56 tablet 2  . Vitamin D, Ergocalciferol, (DRISDOL) 50000 UNITS CAPS capsule Take 1 capsule (50,000 Units total) by mouth every Monday, Wednesday, and Friday. 12 capsule 3   No current facility-administered medications on file prior  to visit.   Medical History:  Past Medical History  Diagnosis Date  . Hypertension 2006  . Depression   . Anxiety   . Allergy    Allergies:  Allergies  Allergen Reactions  . Azithromycin Nausea And Vomiting    Review of Systems:  Review of Systems  Constitutional: Negative.   HENT: Negative.   Eyes: Negative.   Respiratory: Negative.   Cardiovascular: Negative.   Gastrointestinal: Negative.   Genitourinary: Negative.   Musculoskeletal: Negative.   Skin: Negative.   Neurological: Negative.   Endo/Heme/Allergies: Negative.    Psychiatric/Behavioral: Negative.      Family history- Review and unchanged Social history- Review and unchanged Physical Exam: BP 122/78 mmHg  Pulse 76  Temp(Src) 98.6 F (37 C)  Resp 16  Ht 5\' 2"  (1.575 m)  Wt 157 lb (71.215 kg)  BMI 28.71 kg/m2 Wt Readings from Last 3 Encounters:  11/16/14 157 lb (71.215 kg)  08/21/14 161 lb (73.029 kg)  05/10/14 158 lb (71.668 kg)   General Appearance: Well nourished, in no apparent distress. Eyes: PERRLA, EOMs, conjunctiva no swelling or erythema Sinuses: No Frontal/maxillary tenderness ENT/Mouth: Ext aud canals clear, TMs without erythema, bulging. No erythema, swelling, or exudate on post pharynx.  Tonsils not swollen or erythematous. Hearing normal.  Neck: Supple, thyroid normal.  Respiratory: Respiratory effort normal, BS equal bilaterally without rales, rhonchi, wheezing or stridor.  Cardio: RRR with no MRGs. Brisk peripheral pulses without edema.  Abdomen: Soft, + BS.  Non tender, no guarding, rebound, hernias, masses. Lymphatics: Non tender without lymphadenopathy.  Musculoskeletal: Full ROM, 5/5 strength, normal gait.  Skin: Warm, dry without rashes, lesions, ecchymosis.  Neuro: Cranial nerves intact. Normal muscle tone, no cerebellar symptoms. Sensation intact.  Psych: Awake and oriented X 3, normal affect, Insight and Judgment appropriate.    Vicie Mutters, PA-C 5:17 PM [facility]

## 2014-11-20 ENCOUNTER — Other Ambulatory Visit: Payer: Self-pay | Admitting: *Deleted

## 2014-11-20 MED ORDER — DULOXETINE HCL 60 MG PO CPEP: 60.0000 mg | ORAL_CAPSULE | Freq: Every day | ORAL | Status: DC

## 2014-11-21 ENCOUNTER — Other Ambulatory Visit: Payer: Self-pay

## 2014-11-21 MED ORDER — VITAMIN D (ERGOCALCIFEROL) 1.25 MG (50000 UNIT) PO CAPS: 50000.0000 [IU] | ORAL_CAPSULE | ORAL | Status: DC

## 2015-01-01 ENCOUNTER — Other Ambulatory Visit: Payer: Self-pay | Admitting: Physician Assistant

## 2015-01-01 MED ORDER — ALPRAZOLAM 0.5 MG PO TABS: 0.5000 mg | ORAL_TABLET | Freq: Three times a day (TID) | ORAL | Status: DC | PRN

## 2015-01-03 ENCOUNTER — Other Ambulatory Visit: Payer: Self-pay

## 2015-01-03 MED ORDER — ESOMEPRAZOLE MAGNESIUM 40 MG PO CPDR: 40.0000 mg | DELAYED_RELEASE_CAPSULE | Freq: Every day | ORAL | Status: DC

## 2015-01-04 ENCOUNTER — Ambulatory Visit (INDEPENDENT_AMBULATORY_CARE_PROVIDER_SITE_OTHER): Payer: 59 | Admitting: Internal Medicine

## 2015-01-04 ENCOUNTER — Encounter: Payer: Self-pay | Admitting: Internal Medicine

## 2015-01-04 VITALS — BP 138/86 | HR 84 | Temp 98.0°F | Resp 18 | Ht 62.0 in | Wt 160.0 lb

## 2015-01-04 DIAGNOSIS — J019 Acute sinusitis, unspecified: Secondary | ICD-10-CM

## 2015-01-04 MED ORDER — MONTELUKAST SODIUM 10 MG PO TABS: 10.0000 mg | ORAL_TABLET | Freq: Every day | ORAL | Status: DC

## 2015-01-04 MED ORDER — PREDNISONE 20 MG PO TABS: ORAL_TABLET | ORAL | Status: AC

## 2015-01-04 MED ORDER — AMOXICILLIN-POT CLAVULANATE 875-125 MG PO TABS: 1.0000 | ORAL_TABLET | Freq: Two times a day (BID) | ORAL | Status: DC

## 2015-01-04 NOTE — Progress Notes (Signed)
Patient ID: Sylvia Mclean, female   DOB: 1960/11/09, 54 y.o.   MRN: [id number]  HPI  Patient presents to the office for evaluation of cough.  It has been going on for 1 weeks.  Patient reports cough night > day, wet, worse with lying down.  They also endorse change in voice, postnasal drip, shortness of breath, sputum production, wheezing and sinus pressure, mild ear pain.  They have tried antihistamines or intranasal steroid, advil, drinking fluids.  They report that nothing has worked.  They admits to other sick contacts with husband and son, but they are improving and she is not.  Review of Systems  Constitutional: Negative for fever and chills.  HENT: Positive for congestion and tinnitus. Negative for ear discharge, ear pain, hearing loss, nosebleeds and sore throat.   Eyes: Positive for discharge (watery) and redness.  Respiratory: Positive for wheezing. Negative for cough, sputum production and shortness of breath.   Cardiovascular: Negative for chest pain and leg swelling.  Gastrointestinal: Negative.   Genitourinary: Negative.   Musculoskeletal: Negative.   Skin: Negative.   Neurological: Negative for dizziness and headaches.  Psychiatric/Behavioral: Negative.     PE:  Filed Vitals:   01/04/15 1127  BP: 138/86  Pulse: 84  Temp: 98 F (36.7 C)  Resp: 18    General:  Alert and non-toxic, WDWN, NAD HEENT: NCAT, PERLA, EOM normal, no occular discharge or erythema.  Nasal mucosal edema with sinus tenderness to palpation.  Oropharynx clear with minimal oropharyngeal edema and erythema.  Mucous membranes moist and pink. Neck:  Cervical adenopathy Chest:  RRR no MRGs.  Lungs clear to auscultation A&P with no wheezes rhonchi or rales.   Abdomen: +BS x 4 quadrants, soft, non-tender, no guarding, rigidity, or rebound. Skin: warm and dry no rash Neuro: A&Ox4, CN II-XII grossly intact  Assessment and Plan:   1. Acute sinusitis, recurrence not specified, unspecified  location -patient with URI symptoms virus vs. Allergic rhinitis, vs. Possible sinusitis.  Patient already on flonase and daily zyrtec.  Will start on augmentin and prednisone.  Patient also to start singular oral.  Patient to use symptomatic treatment.  Doubt PNA at this time.  Patient to return for worsening SOB or intractable fevers.  Patient has schedule f/u in July.  Patient to call as needed.

## 2015-01-04 NOTE — Patient Instructions (Signed)
Sinusitis Sinusitis is redness, soreness, and inflammation of the paranasal sinuses. Paranasal sinuses are air pockets within the bones of your face (beneath the eyes, the middle of the forehead, or above the eyes). In healthy paranasal sinuses, mucus is able to drain out, and air is able to circulate through them by way of your nose. However, when your paranasal sinuses are inflamed, mucus and air can become trapped. This can allow bacteria and other germs to grow and cause infection. Sinusitis can develop quickly and last only a short time (acute) or continue over a long period (chronic). Sinusitis that lasts for more than 12 weeks is considered chronic.  CAUSES  Causes of sinusitis include:  Allergies.  Structural abnormalities, such as displacement of the cartilage that separates your nostrils (deviated septum), which can decrease the air flow through your nose and sinuses and affect sinus drainage.  Functional abnormalities, such as when the small hairs (cilia) that line your sinuses and help remove mucus do not work properly or are not present. SIGNS AND SYMPTOMS  Symptoms of acute and chronic sinusitis are the same. The primary symptoms are pain and pressure around the affected sinuses. Other symptoms include:  Upper toothache.  Earache.  Headache.  Bad breath.  Decreased sense of smell and taste.  A cough, which worsens when you are lying flat.  Fatigue.  Fever.  Thick drainage from your nose, which often is green and may contain pus (purulent).  Swelling and warmth over the affected sinuses. DIAGNOSIS  Your health care provider will perform a physical exam. During the exam, your health care provider may:  Look in your nose for signs of abnormal growths in your nostrils (nasal polyps).  Tap over the affected sinus to check for signs of infection.  View the inside of your sinuses (endoscopy) using an imaging device that has a light attached (endoscope). If your health  care provider suspects that you have chronic sinusitis, one or more of the following tests may be recommended:  Allergy tests.  Nasal culture. A sample of mucus is taken from your nose, sent to a lab, and screened for bacteria.  Nasal cytology. A sample of mucus is taken from your nose and examined by your health care provider to determine if your sinusitis is related to an allergy. TREATMENT  Most cases of acute sinusitis are related to a viral infection and will resolve on their own within 10 days. Sometimes medicines are prescribed to help relieve symptoms (pain medicine, decongestants, nasal steroid sprays, or saline sprays).  However, for sinusitis related to a bacterial infection, your health care provider will prescribe antibiotic medicines. These are medicines that will help kill the bacteria causing the infection.  Rarely, sinusitis is caused by a fungal infection. In theses cases, your health care provider will prescribe antifungal medicine. For some cases of chronic sinusitis, surgery is needed. Generally, these are cases in which sinusitis recurs more than 3 times per year, despite other treatments. HOME CARE INSTRUCTIONS   Drink plenty of water. Water helps thin the mucus so your sinuses can drain more easily.  Use a humidifier.  Inhale steam 3 to 4 times a day (for example, sit in the bathroom with the shower running).  Apply a warm, moist washcloth to your face 3 to 4 times a day, or as directed by your health care provider.  Use saline nasal sprays to help moisten and clean your sinuses.  Take medicines only as directed by your health care provider.    If you were prescribed either an antibiotic or antifungal medicine, finish it all even if you start to feel better. SEEK IMMEDIATE MEDICAL CARE IF:  You have increasing pain or severe headaches.  You have nausea, vomiting, or drowsiness.  You have swelling around your face.  You have vision problems.  You have a stiff  neck.  You have difficulty breathing. MAKE SURE YOU:   Understand these instructions.  Will watch your condition.  Will get help right away if you are not doing well or get worse. Document Released: 10/13/2005 Document Revised: 02/27/2014 Document Reviewed: 10/28/2011 [facility] Patient Information 2015 [place], [place]. This information is not intended to replace advice given to you by your health care provider. Make sure you discuss any questions you have with your health care provider.  Allergic Rhinitis Allergic rhinitis is when the mucous membranes in the nose respond to allergens. Allergens are particles in the air that cause your body to have an allergic reaction. This causes you to release allergic antibodies. Through a chain of events, these eventually cause you to release histamine into the blood stream. Although meant to protect the body, it is this release of histamine that causes your discomfort, such as frequent sneezing, congestion, and an itchy, runny nose.  CAUSES  Seasonal allergic rhinitis (hay fever) is caused by pollen allergens that may come from grasses, trees, and weeds. Year-round allergic rhinitis (perennial allergic rhinitis) is caused by allergens such as house dust mites, pet dander, and mold spores.  SYMPTOMS   Nasal stuffiness (congestion).  Itchy, runny nose with sneezing and tearing of the eyes. DIAGNOSIS  Your health care provider can help you determine the allergen or allergens that trigger your symptoms. If you and your health care provider are unable to determine the allergen, skin or blood testing may be used. TREATMENT  Allergic rhinitis does not have a cure, but it can be controlled by:  Medicines and allergy shots (immunotherapy).  Avoiding the allergen. Hay fever may often be treated with antihistamines in pill or nasal spray forms. Antihistamines block the effects of histamine. There are over-the-counter medicines that may help with nasal  congestion and swelling around the eyes. Check with your health care provider before taking or giving this medicine.  If avoiding the allergen or the medicine prescribed do not work, there are many new medicines your health care provider can prescribe. Stronger medicine may be used if initial measures are ineffective. Desensitizing injections can be used if medicine and avoidance does not work. Desensitization is when a patient is given ongoing shots until the body becomes less sensitive to the allergen. Make sure you follow up with your health care provider if problems continue. HOME CARE INSTRUCTIONS It is not possible to completely avoid allergens, but you can reduce your symptoms by taking steps to limit your exposure to them. It helps to know exactly what you are allergic to so that you can avoid your specific triggers. SEEK MEDICAL CARE IF:   You have a fever.  You develop a cough that does not stop easily (persistent).  You have shortness of breath.  You start wheezing.  Symptoms interfere with normal daily activities. Document Released: 07/08/2001 Document Revised: 10/18/2013 Document Reviewed: 06/20/2013 [facility] Patient Information 2015 [place], [place]. This information is not intended to replace advice given to you by your health care provider. Make sure you discuss any questions you have with your health care provider.

## 2015-01-08 ENCOUNTER — Ambulatory Visit (INDEPENDENT_AMBULATORY_CARE_PROVIDER_SITE_OTHER): Payer: 59 | Admitting: Physician Assistant

## 2015-01-08 ENCOUNTER — Other Ambulatory Visit (HOSPITAL_COMMUNITY)
Admission: RE | Admit: 2015-01-08 | Discharge: 2015-01-08 | Disposition: A | Discharge status: Home / Self Care | Payer: 59 | Source: Ambulatory Visit | Attending: Physician Assistant | Admitting: Physician Assistant

## 2015-01-08 ENCOUNTER — Encounter: Payer: Self-pay | Admitting: Physician Assistant

## 2015-01-08 VITALS — BP 110/72 | HR 88 | Temp 97.9°F | Resp 16 | Ht 62.0 in | Wt 158.0 lb

## 2015-01-08 DIAGNOSIS — R1030 Lower abdominal pain, unspecified: Secondary | ICD-10-CM

## 2015-01-08 DIAGNOSIS — Z1151 Encounter for screening for human papillomavirus (HPV): Secondary | ICD-10-CM | POA: Diagnosis present

## 2015-01-08 DIAGNOSIS — Z01411 Encounter for gynecological examination (general) (routine) with abnormal findings: Secondary | ICD-10-CM | POA: Insufficient documentation

## 2015-01-08 DIAGNOSIS — F17200 Nicotine dependence, unspecified, uncomplicated: Secondary | ICD-10-CM

## 2015-01-08 DIAGNOSIS — R05 Cough: Secondary | ICD-10-CM

## 2015-01-08 DIAGNOSIS — N950 Postmenopausal bleeding: Secondary | ICD-10-CM

## 2015-01-08 DIAGNOSIS — Z720 Tobacco use: Secondary | ICD-10-CM

## 2015-01-08 DIAGNOSIS — R059 Cough, unspecified: Secondary | ICD-10-CM

## 2015-01-08 NOTE — Progress Notes (Signed)
Subjective:    Patient ID: Sylvia Mclean, female    DOB: 03-10-1961, 54 y.o.   MRN: [id number]  HPI 54 y.o. postmenopausal, hx of tobacco use female presents for AB cramping and vaginal bleeding. She has not had a menstrual period for 14 months, then this AM she woke up with suprapubic and lower back cramping, she had some blood on her TP and in the toliet. Later on she coughed and had blood come out on her panties. No blood in stool, denies frequency/urgency, hesitancy. She was treated for a sinusitis 03/10 with augmentin and prednisone, she is still on the ABX and prednisone, she states she is not feeling as bad but still having very wet/barking cough.   She has not had a MGM, CXR or colonoscopy as we have discussed. She had a normal pap 04/2012, due this year. She has history of 1 abnormal pap 20+ years ago. She is sexually active, no bleeding/pain with intercourse.   Review of Systems  Constitutional: Positive for fatigue. Negative for fever, chills, diaphoresis, activity change, appetite change and unexpected weight change.  HENT: Positive for congestion, postnasal drip and sinus pressure (better with ABX). Negative for dental problem, drooling, ear discharge, ear pain, facial swelling, hearing loss, mouth sores, nosebleeds, rhinorrhea, sneezing, sore throat, tinnitus, trouble swallowing and voice change.   Respiratory: Positive for cough. Negative for apnea, choking, chest tightness, shortness of breath, wheezing and stridor.   Cardiovascular: Negative.   Gastrointestinal: Positive for abdominal pain. Negative for nausea, vomiting, diarrhea, constipation, blood in stool, abdominal distention, anal bleeding and rectal pain.  Genitourinary: Positive for vaginal bleeding and pelvic pain. Negative for dysuria, urgency, frequency, hematuria, flank pain, decreased urine volume, vaginal discharge, enuresis, difficulty urinating, genital sores, vaginal pain, menstrual problem and dyspareunia.   Musculoskeletal: Positive for back pain. Negative for myalgias, joint swelling, arthralgias, gait problem, neck pain and neck stiffness.  Skin: Negative.   Neurological: Negative.        Objective:   Physical Exam  Constitutional: She is oriented to person, place, and time. She appears well-developed and well-nourished. No distress.  HENT:  Head: Normocephalic and atraumatic.  Eyes: Conjunctivae and EOM are normal. Pupils are equal, round, and reactive to light.  Neck: Normal range of motion. Neck supple.  Cardiovascular: Normal rate and regular rhythm.   Pulmonary/Chest: Effort normal and breath sounds normal. No respiratory distress. She has no wheezes.  CTAB  Abdominal: Soft. Bowel sounds are normal. She exhibits no distension and no mass. There is tenderness. There is no rebound and no guarding.  Genitourinary: Rectal exam shows no external hemorrhoid, no internal hemorrhoid, no mass and anal tone normal. Guaiac negative stool. There is no rash, tenderness, lesion or injury on the right labia. There is no rash, tenderness, lesion or injury on the left labia. Uterus is enlarged and tender. Cervix exhibits discharge (had to clear away large blood clots from cervix and vaginal vault. ). Cervix exhibits no motion tenderness and no friability. There is bleeding in the vagina. No tenderness in the vagina. No signs of injury around the vagina.  Musculoskeletal: Normal range of motion.  Lymphadenopathy:    She has no cervical adenopathy.  Neurological: She is alert and oriented to person, place, and time. No cranial nerve deficit.       Assessment & Plan:  Cough/smoking- get CXR, O2 98%, finish ABX/prednisone Lower AB pain- check urine, labs, get [place] Post menopausal bleeding- will get PAP, get Pelvic US rule out  uterine/endometrial cancer Smoking cessation-  instruction/counseling given, counseled patient on the dangers of tobacco use, advised patient to stop smoking, and reviewed  strategies to maximize success, patient not ready to quit at this time.

## 2015-01-08 NOTE — Patient Instructions (Signed)
Postmenopausal Bleeding  Postmenopausal bleeding is any bleeding a woman has after she has entered into menopause. Menopause is the end of a woman's fertile years. After menopause, a woman no longer ovulates or has menstrual periods.   Postmenopausal bleeding can be caused by various things. Any type of postmenopausal bleeding, even if it appears to be a typical menstrual period, is concerning. This should be evaluated by your health care provider. Any treatment will depend on the cause of the bleeding.  HOME CARE INSTRUCTIONS  Monitor your condition for any changes. The following actions may help to alleviate any discomfort you are experiencing:  · Avoid the use of tampons and douches as directed by your health care provider.   · Change your pads frequently.  · Get regular pelvic exams and Pap tests.  · Keep all follow-up appointments for diagnostic tests as directed by your health care provider.  SEEK MEDICAL CARE IF:   · Your bleeding lasts more than 1 week.  · You have abdominal pain.  · You have bleeding with sexual intercourse.  SEEK IMMEDIATE MEDICAL CARE IF:   · You have a fever, chills, headache, dizziness, muscle aches, and bleeding.  · You have severe pain with bleeding.  · You are passing blood clots.  · You have bleeding and need more than 1 pad an hour.  · You feel faint.  MAKE SURE YOU:  · Understand these instructions.  · Will watch your condition.  · Will get help right away if you are not doing well or get worse.  Document Released: 01/21/2006 Document Revised: 08/03/2013 Document Reviewed: 05/12/2013  [employer]® Patient Information ©2015 [employer]. This information is not intended to replace advice given to you by your health care provider. Make sure you discuss any questions you have with your health care provider.

## 2015-01-09 ENCOUNTER — Ambulatory Visit
Admission: RE | Admit: 2015-01-09 | Discharge: 2015-01-09 | Disposition: A | Discharge status: Home / Self Care | Payer: 59 | Source: Ambulatory Visit | Attending: Physician Assistant | Admitting: Physician Assistant

## 2015-01-09 ENCOUNTER — Other Ambulatory Visit: Payer: Self-pay | Admitting: Physician Assistant

## 2015-01-09 DIAGNOSIS — R1030 Lower abdominal pain, unspecified: Secondary | ICD-10-CM

## 2015-01-09 DIAGNOSIS — N950 Postmenopausal bleeding: Secondary | ICD-10-CM

## 2015-01-09 LAB — URINALYSIS, ROUTINE W REFLEX MICROSCOPIC
Bilirubin Urine: NEGATIVE
GLUCOSE, UA: NEGATIVE mg/dL
KETONES UR: NEGATIVE mg/dL
Nitrite: NEGATIVE
Protein, ur: 100 mg/dL — AB
Specific Gravity, Urine: 1.016 (ref 1.005–1.030)
UROBILINOGEN UA: 0.2 mg/dL (ref 0.0–1.0)
pH: 6 (ref 5.0–8.0)

## 2015-01-09 LAB — CBC WITH DIFFERENTIAL/PLATELET
BASOS ABS: 0 10*3/uL (ref 0.0–0.1)
Basophils Relative: 0 % (ref 0–1)
Eosinophils Absolute: 0 10*3/uL (ref 0.0–0.7)
Eosinophils Relative: 0 % (ref 0–5)
HCT: 47.5 % — ABNORMAL HIGH (ref 36.0–46.0)
Hemoglobin: 16 g/dL — ABNORMAL HIGH (ref 12.0–15.0)
Lymphocytes Relative: 25 % (ref 12–46)
Lymphs Abs: 2.9 10*3/uL (ref 0.7–4.0)
MCH: 32.3 pg (ref 26.0–34.0)
MCHC: 33.7 g/dL (ref 30.0–36.0)
MCV: 95.8 fL (ref 78.0–100.0)
MONO ABS: 1.1 10*3/uL — AB (ref 0.1–1.0)
MPV: 8.6 fL (ref 8.6–12.4)
Monocytes Relative: 10 % (ref 3–12)
NEUTROS ABS: 7.4 10*3/uL (ref 1.7–7.7)
Neutrophils Relative %: 65 % (ref 43–77)
PLATELETS: 354 10*3/uL (ref 150–400)
RBC: 4.96 MIL/uL (ref 3.87–5.11)
RDW: 13.6 % (ref 11.5–15.5)
WBC: 11.4 10*3/uL — AB (ref 4.0–10.5)

## 2015-01-09 LAB — URINE CULTURE
Colony Count: NO GROWTH
Organism ID, Bacteria: NO GROWTH

## 2015-01-09 LAB — URINALYSIS, MICROSCOPIC ONLY
Bacteria, UA: NONE SEEN
Casts: NONE SEEN
Crystals: NONE SEEN

## 2015-01-09 LAB — BASIC METABOLIC PANEL WITH GFR
BUN: 14 mg/dL (ref 6–23)
CALCIUM: 9.5 mg/dL (ref 8.4–10.5)
CO2: 29 meq/L (ref 19–32)
CREATININE: 0.88 mg/dL (ref 0.50–1.10)
Chloride: 104 mEq/L (ref 96–112)
GFR, Est African American: 87 mL/min
GFR, Est Non African American: 75 mL/min
Glucose, Bld: 74 mg/dL (ref 70–99)
Potassium: 4.1 mEq/L (ref 3.5–5.3)
Sodium: 144 mEq/L (ref 135–145)

## 2015-01-09 LAB — HEPATIC FUNCTION PANEL
ALT: 9 U/L (ref 0–35)
AST: 12 U/L (ref 0–37)
Albumin: 4.5 g/dL (ref 3.5–5.2)
Alkaline Phosphatase: 57 U/L (ref 39–117)
BILIRUBIN TOTAL: 0.4 mg/dL (ref 0.2–1.2)
Bilirubin, Direct: 0.1 mg/dL (ref 0.0–0.3)
Indirect Bilirubin: 0.3 mg/dL (ref 0.2–1.2)
Total Protein: 6.9 g/dL (ref 6.0–8.3)

## 2015-01-09 LAB — TSH: TSH: 2.31 u[IU]/mL (ref 0.350–4.500)

## 2015-01-10 LAB — CYTOLOGY - PAP

## 2015-02-05 ENCOUNTER — Other Ambulatory Visit: Payer: Self-pay

## 2015-02-05 MED ORDER — OLMESARTAN MEDOXOMIL-HCTZ 20-12.5 MG PO TABS: 1.0000 | ORAL_TABLET | Freq: Every day | ORAL | Status: DC

## 2015-03-14 ENCOUNTER — Other Ambulatory Visit: Payer: Self-pay

## 2015-03-14 MED ORDER — ALPRAZOLAM 0.5 MG PO TABS: 0.5000 mg | ORAL_TABLET | Freq: Three times a day (TID) | ORAL | Status: DC | PRN

## 2015-04-11 ENCOUNTER — Telehealth: Payer: Self-pay | Admitting: Internal Medicine

## 2015-04-11 NOTE — Telephone Encounter (Signed)
Patient calling with 3 days of diarrhea, abdominal pain, fever and chills and decreased appetite.   Consider gastroenteritis vs. Diverticulitis vs. IBS.  NO colonoscopy on file.  Bring patient in for office visit.  [facility] office is notified of need for appointment.

## 2015-04-12 ENCOUNTER — Other Ambulatory Visit: Payer: Self-pay

## 2015-04-12 MED ORDER — DULOXETINE HCL 60 MG PO CPEP: 60.0000 mg | ORAL_CAPSULE | Freq: Every day | ORAL | Status: DC

## 2015-05-14 ENCOUNTER — Ambulatory Visit: Payer: 59 | Admitting: Physician Assistant

## 2015-05-14 ENCOUNTER — Encounter: Payer: Self-pay | Admitting: Physician Assistant

## 2015-05-14 VITALS — BP 132/90 | HR 84 | Temp 97.9°F | Resp 16 | Ht 62.0 in | Wt 159.6 lb

## 2015-05-14 DIAGNOSIS — E559 Vitamin D deficiency, unspecified: Secondary | ICD-10-CM

## 2015-05-14 DIAGNOSIS — K219 Gastro-esophageal reflux disease without esophagitis: Secondary | ICD-10-CM | POA: Insufficient documentation

## 2015-05-14 DIAGNOSIS — F329 Major depressive disorder, single episode, unspecified: Secondary | ICD-10-CM

## 2015-05-14 DIAGNOSIS — Z79899 Other long term (current) drug therapy: Secondary | ICD-10-CM

## 2015-05-14 DIAGNOSIS — T7840XD Allergy, unspecified, subsequent encounter: Secondary | ICD-10-CM

## 2015-05-14 DIAGNOSIS — I10 Essential (primary) hypertension: Secondary | ICD-10-CM

## 2015-05-14 DIAGNOSIS — Z0000 Encounter for general adult medical examination without abnormal findings: Secondary | ICD-10-CM

## 2015-05-14 DIAGNOSIS — D649 Anemia, unspecified: Secondary | ICD-10-CM

## 2015-05-14 DIAGNOSIS — F419 Anxiety disorder, unspecified: Secondary | ICD-10-CM

## 2015-05-14 DIAGNOSIS — Z1211 Encounter for screening for malignant neoplasm of colon: Secondary | ICD-10-CM

## 2015-05-14 DIAGNOSIS — F17200 Nicotine dependence, unspecified, uncomplicated: Secondary | ICD-10-CM | POA: Insufficient documentation

## 2015-05-14 DIAGNOSIS — F32A Depression, unspecified: Secondary | ICD-10-CM

## 2015-05-14 LAB — CBC WITH DIFFERENTIAL/PLATELET
BASOS ABS: 0 10*3/uL (ref 0.0–0.1)
Basophils Relative: 0 % (ref 0–1)
EOS ABS: 0.1 10*3/uL (ref 0.0–0.7)
Eosinophils Relative: 2 % (ref 0–5)
HEMATOCRIT: 47.2 % — AB (ref 36.0–46.0)
Hemoglobin: 16.2 g/dL — ABNORMAL HIGH (ref 12.0–15.0)
Lymphocytes Relative: 32 % (ref 12–46)
Lymphs Abs: 2 10*3/uL (ref 0.7–4.0)
MCH: 32.5 pg (ref 26.0–34.0)
MCHC: 34.3 g/dL (ref 30.0–36.0)
MCV: 94.6 fL (ref 78.0–100.0)
MONO ABS: 0.4 10*3/uL (ref 0.1–1.0)
MPV: 8.5 fL — ABNORMAL LOW (ref 8.6–12.4)
Monocytes Relative: 7 % (ref 3–12)
NEUTROS PCT: 59 % (ref 43–77)
Neutro Abs: 3.7 10*3/uL (ref 1.7–7.7)
Platelets: 283 10*3/uL (ref 150–400)
RBC: 4.99 MIL/uL (ref 3.87–5.11)
RDW: 14.6 % (ref 11.5–15.5)
WBC: 6.3 10*3/uL (ref 4.0–10.5)

## 2015-05-14 LAB — HEMOGLOBIN A1C
Hgb A1c MFr Bld: 5.5 % (ref <5.7)
Mean Plasma Glucose: 111 mg/dL (ref <117)

## 2015-05-14 MED ORDER — OLMESARTAN MEDOXOMIL-HCTZ 20-12.5 MG PO TABS: 1.0000 | ORAL_TABLET | Freq: Every day | ORAL | Status: DC

## 2015-05-14 MED ORDER — VARENICLINE TARTRATE 1 MG PO TABS: 1.0000 mg | ORAL_TABLET | Freq: Two times a day (BID) | ORAL | Status: DC

## 2015-05-14 NOTE — Patient Instructions (Addendum)
If you have a smart phone, please look up Smoke Free app, this will help you stay on track and give you information about money you have saved, life that you have gained back and a ton of more information.   We are giving you chantix for smoking cessation. You can do it! And we are here to help! You may have heard some scary side effects about chantix, the three most common I hear about are nausea, crazy dreams and depression.  However, I like for my patients to try to stay on 1/2 a tablet twice a day rather than one tablet twice a day as normally prescribed. This helps decrease the chances of side effects and helps save money by making a one month prescription last two months  Please start the prescription this way:  Start 1/2 tablet by mouth once daily after food with a full glass of water for 3 days Then do 1/2 tablet by mouth twice daily for 4 days. During this first week you can smoke, but try to stop after this week.  At this point we have several options: 1) continue on 1/2 tablet twice a day- which I encourage you to do. You can stay on this dose the rest of the time on the medication or if you still feel the need to smoke you can do one of the two options below. 2) do one tablet in the morning and 1/2 in the evening which helps decrease dreams.  Or just one in the AM if you have bad dreams.  3) do one tablet twice a day.   What if I miss a dose? If you miss a dose, take it as soon as you can. If it is almost time for your next dose, take only that dose. Do not take double or extra doses.  What should I watch for while using this medicine? Visit your doctor or health care professional for regular check ups. Ask for ongoing advice and encouragement from your doctor or healthcare professional, friends, and family to help you quit. If you smoke while on this medication, quit again  Your mouth may get dry. Chewing sugarless gum or hard candy, and drinking plenty of water may help. Contact  your doctor if the problem does not go away or is severe.  You may get drowsy or dizzy. Do not drive, use machinery, or do anything that needs mental alertness until you know how this medicine affects you. Do not stand or sit up quickly, especially if you are an older patient.   The use of this medicine may increase the chance of suicidal thoughts or actions. Pay special attention to how you are responding while on this medicine. Any worsening of mood, or thoughts of suicide or dying should be reported to your health care professional right away.  ADVANTAGES OF QUITTING SMOKING  Within 20 minutes, blood pressure decreases. Your pulse is at normal level.  After 8 hours, carbon monoxide levels in the blood return to normal. Your oxygen level increases.  After 24 hours, the chance of having a heart attack starts to decrease. Your breath, hair, and body stop smelling like smoke.  After 48 hours, damaged nerve endings begin to recover. Your sense of taste and smell improve.  After 72 hours, the body is virtually free of nicotine. Your bronchial tubes relax and breathing becomes easier.  After 2 to 12 weeks, lungs can hold more air. Exercise becomes easier and circulation improves.  After 1 year, the  risk of coronary heart disease is cut in half.  After 5 years, the risk of stroke falls to the same as a nonsmoker.  After 10 years, the risk of lung cancer is cut in half and the risk of other cancers decreases significantly.  After 15 years, the risk of coronary heart disease drops, usually to the level of a nonsmoker.  You will have extra money to spend on things other than cigarettes.   Preventive Care for Adults  A healthy lifestyle and preventive care can promote health and wellness. Preventive health guidelines for women include the following key practices.  A routine yearly physical is a good way to check with your health care provider about your health and preventive screening. It  is a chance to share any concerns and updates on your health and to receive a thorough exam.  Visit your dentist for a routine exam and preventive care every 6 months. Brush your teeth twice a day and floss once a day. Good oral hygiene prevents tooth decay and gum disease.  The frequency of eye exams is based on your age, health, family medical history, use of contact lenses, and other factors. Follow your health care provider's recommendations for frequency of eye exams.  Eat a healthy diet. Foods like vegetables, fruits, whole grains, low-fat dairy products, and lean protein foods contain the nutrients you need without too many calories. Decrease your intake of foods high in solid fats, added sugars, and salt. Eat the right amount of calories for you.Get information about a proper diet from your health care provider, if necessary.  Regular physical exercise is one of the most important things you can do for your health. Most adults should get at least 150 minutes of moderate-intensity exercise (any activity that increases your heart rate and causes you to sweat) each week. In addition, most adults need muscle-strengthening exercises on 2 or more days a week.  Maintain a healthy weight. The body mass index (BMI) is a screening tool to identify possible weight problems. It provides an estimate of body fat based on height and weight. Your health care provider can find your BMI and can help you achieve or maintain a healthy weight.For adults 20 years and older:  A BMI below 18.5 is considered underweight.  A BMI of 18.5 to 24.9 is normal.  A BMI of 25 to 29.9 is considered overweight.  A BMI of 30 and above is considered obese.  Maintain normal blood lipids and cholesterol levels by exercising and minimizing your intake of saturated fat. Eat a balanced diet with plenty of fruit and vegetables. Blood tests for lipids and cholesterol should begin at age 65 and be repeated every 5 years. If your  lipid or cholesterol levels are high, you are over 50, or you are at high risk for heart disease, you may need your cholesterol levels checked more frequently.Ongoing high lipid and cholesterol levels should be treated with medicines if diet and exercise are not working.  If you smoke, find out from your health care provider how to quit. If you do not use tobacco, do not start.  Lung cancer screening is recommended for adults aged 33-80 years who are at high risk for developing lung cancer because of a history of smoking. A yearly low-dose CT scan of the lungs is recommended for people who have at least a 30-pack-year history of smoking and are a current smoker or have quit within the past 15 years. A pack year of smoking  is smoking an average of 1 pack of cigarettes a day for 1 year (for example: 1 pack a day for 30 years or 2 packs a day for 15 years). Yearly screening should continue until the smoker has stopped smoking for at least 15 years. Yearly screening should be stopped for people who develop a health problem that would prevent them from having lung cancer treatment.  High blood pressure causes heart disease and increases the risk of stroke. Your blood pressure should be checked at least every 1 to 2 years. Ongoing high blood pressure should be treated with medicines if weight loss and exercise do not work.  If you are 20-62 years old, ask your health care provider if you should take aspirin to prevent strokes.  Diabetes screening involves taking a blood sample to check your fasting blood sugar level. This should be done once every 3 years, after age 54, if you are within normal weight and without risk factors for diabetes. Testing should be considered at a younger age or be carried out more frequently if you are overweight and have at least 1 risk factor for diabetes.  Breast cancer screening is essential preventive care for women. You should practice "breast self-awareness." This means  understanding the normal appearance and feel of your breasts and may include breast self-examination. Any changes detected, no matter how small, should be reported to a health care provider. Women in their 29s and 30s should have a clinical breast exam (CBE) by a health care provider as part of a regular health exam every 1 to 3 years. After age 47, women should have a CBE every year. Starting at age 4, women should consider having a mammogram (breast X-ray test) every year. Women who have a family history of breast cancer should talk to their health care provider about genetic screening. Women at a high risk of breast cancer should talk to their health care providers about having an MRI and a mammogram every year.  Breast cancer gene (BRCA)-related cancer risk assessment is recommended for women who have family members with BRCA-related cancers. BRCA-related cancers include breast, ovarian, tubal, and peritoneal cancers. Having family members with these cancers may be associated with an increased risk for harmful changes (mutations) in the breast cancer genes BRCA1 and BRCA2. Results of the assessment will determine the need for genetic counseling and BRCA1 and BRCA2 testing.  Routine pelvic exams to screen for cancer are no longer recommended for nonpregnant women who are considered low risk for cancer of the pelvic organs (ovaries, uterus, and vagina) and who do not have symptoms. Ask your health care provider if a screening pelvic exam is right for you.  If you have had past treatment for cervical cancer or a condition that could lead to cancer, you need Pap tests and screening for cancer for at least 20 years after your treatment. If Pap tests have been discontinued, your risk factors (such as having a new sexual partner) need to be reassessed to determine if screening should be resumed. Some women have medical problems that increase the chance of getting cervical cancer. In these cases, your health care  provider may recommend more frequent screening and Pap tests.  Colorectal cancer can be detected and often prevented. Most routine colorectal cancer screening begins at the age of 20 years and continues through age 62 years. However, your health care provider may recommend screening at an earlier age if you have risk factors for colon cancer. On a yearly basis,  your health care provider may provide home test kits to check for hidden blood in the stool. Use of a small camera at the end of a tube, to directly examine the colon (sigmoidoscopy or colonoscopy), can detect the earliest forms of colorectal cancer. Talk to your health care provider about this at age 40, when routine screening begins. Direct exam of the colon should be repeated every 5-10 years through age 57 years, unless early forms of pre-cancerous polyps or small growths are found.  Hepatitis C blood testing is recommended for all people born from [birth year] through 1965 and any individual with known risks for hepatitis C.  Pra  Osteoporosis is a disease in which the bones lose minerals and strength with aging. This can result in serious bone fractures or breaks. The risk of osteoporosis can be identified using a bone density scan. Women ages 10 years and over and women at risk for fractures or osteoporosis should discuss screening with their health care providers. Ask your health care provider whether you should take a calcium supplement or vitamin D to reduce the rate of osteoporosis.  Menopause can be associated with physical symptoms and risks. Hormone replacement therapy is available to decrease symptoms and risks. You should talk to your health care provider about whether hormone replacement therapy is right for you.  Use sunscreen. Apply sunscreen liberally and repeatedly throughout the day. You should seek shade when your shadow is shorter than you. Protect yourself by wearing long sleeves, pants, a wide-brimmed hat, and sunglasses year  round, whenever you are outdoors.  Once a month, do a whole body skin exam, using a mirror to look at the skin on your back. Tell your health care provider of new moles, moles that have irregular borders, moles that are larger than a pencil eraser, or moles that have changed in shape or color.  Stay current with required vaccines (immunizations).  Influenza vaccine. All adults should be immunized every year.  Tetanus, diphtheria, and acellular pertussis (Td, Tdap) vaccine. Pregnant women should receive 1 dose of Tdap vaccine during each pregnancy. The dose should be obtained regardless of the length of time since the last dose. Immunization is preferred during the 27th-36th week of gestation. An adult who has not previously received Tdap or who does not know her vaccine status should receive 1 dose of Tdap. This initial dose should be followed by tetanus and diphtheria toxoids (Td) booster doses every 10 years. Adults with an unknown or incomplete history of completing a 3-dose immunization series with Td-containing vaccines should begin or complete a primary immunization series including a Tdap dose. Adults should receive a Td booster every 10 years.  Varicella vaccine. An adult without evidence of immunity to varicella should receive 2 doses or a second dose if she has previously received 1 dose. Pregnant females who do not have evidence of immunity should receive the first dose after pregnancy. This first dose should be obtained before leaving the health care facility. The second dose should be obtained 4-8 weeks after the first dose.  Human papillomavirus (HPV) vaccine. Females aged 13-26 years who have not received the vaccine previously should obtain the 3-dose series. The vaccine is not recommended for use in pregnant females. However, pregnancy testing is not needed before receiving a dose. If a female is found to be pregnant after receiving a dose, no treatment is needed. In that case, the  remaining doses should be delayed until after the pregnancy. Immunization is recommended for any person  with an immunocompromised condition through the age of 47 years if she did not get any or all doses earlier. During the 3-dose series, the second dose should be obtained 4-8 weeks after the first dose. The third dose should be obtained 24 weeks after the first dose and 16 weeks after the second dose.  Zoster vaccine. One dose is recommended for adults aged 59 years or older unless certain conditions are present.  Measles, mumps, and rubella (MMR) vaccine. Adults born before [birth year] generally are considered immune to measles and mumps. Adults born in [birth year] or later should have 1 or more doses of MMR vaccine unless there is a contraindication to the vaccine or there is laboratory evidence of immunity to each of the three diseases. A routine second dose of MMR vaccine should be obtained at least 28 days after the first dose for students attending postsecondary schools, health care workers, or international travelers. People who received inactivated measles vaccine or an unknown type of measles vaccine during 1963-1967 should receive 2 doses of MMR vaccine. People who received inactivated mumps vaccine or an unknown type of mumps vaccine before 1979 and are at high risk for mumps infection should consider immunization with 2 doses of MMR vaccine. For females of childbearing age, rubella immunity should be determined. If there is no evidence of immunity, females who are not pregnant should be vaccinated. If there is no evidence of immunity, females who are pregnant should delay immunization until after pregnancy. Unvaccinated health care workers born before [birth year] who lack laboratory evidence of measles, mumps, or rubella immunity or laboratory confirmation of disease should consider measles and mumps immunization with 2 doses of MMR vaccine or rubella immunization with 1 dose of MMR vaccine.  Pneumococcal 13-valent  conjugate (PCV13) vaccine. When indicated, a person who is uncertain of her immunization history and has no record of immunization should receive the PCV13 vaccine. An adult aged 5 years or older who has certain medical conditions and has not been previously immunized should receive 1 dose of PCV13 vaccine. This PCV13 should be followed with a dose of pneumococcal polysaccharide (PPSV23) vaccine. The PPSV23 vaccine dose should be obtained at least 8 weeks after the dose of PCV13 vaccine. An adult aged 41 years or older who has certain medical conditions and previously received 1 or more doses of PPSV23 vaccine should receive 1 dose of PCV13. The PCV13 vaccine dose should be obtained 1 or more years after the last PPSV23 vaccine dose.    Pneumococcal polysaccharide (PPSV23) vaccine. When PCV13 is also indicated, PCV13 should be obtained first. All adults aged 15 years and older should be immunized. An adult younger than age 20 years who has certain medical conditions should be immunized. Any person who resides in a nursing home or long-term care facility should be immunized. An adult smoker should be immunized. People with an immunocompromised condition and certain other conditions should receive both PCV13 and PPSV23 vaccines. People with human immunodeficiency virus (HIV) infection should be immunized as soon as possible after diagnosis. Immunization during chemotherapy or radiation therapy should be avoided. Routine use of PPSV23 vaccine is not recommended for American Indians, [place] Natives, or people younger than 65 years unless there are medical conditions that require PPSV23 vaccine. When indicated, people who have unknown immunization and have no record of immunization should receive PPSV23 vaccine. One-time revaccination 5 years after the first dose of PPSV23 is recommended for people aged 19-64 years who have chronic kidney failure, nephrotic syndrome, asplenia,  or immunocompromised conditions. People  who received 1-2 doses of PPSV23 before age 1 years should receive another dose of PPSV23 vaccine at age 66 years or later if at least 5 years have passed since the previous dose. Doses of PPSV23 are not needed for people immunized with PPSV23 at or after age 53 years.  Preventive Services / Frequency   Ages 21 to 74 years  Blood pressure check.  Lipid and cholesterol check.  Lung cancer screening. / Every year if you are aged 11-80 years and have a 30-pack-year history of smoking and currently smoke or have quit within the past 15 years. Yearly screening is stopped once you have quit smoking for at least 15 years or develop a health problem that would prevent you from having lung cancer treatment.  Clinical breast exam.** / Every year after age 80 years.  BRCA-related cancer risk assessment.** / For women who have family members with a BRCA-related cancer (breast, ovarian, tubal, or peritoneal cancers).  Mammogram.** / Every year beginning at age 88 years and continuing for as long as you are in good health. Consult with your health care provider.  Pap test.** / Every 3 years starting at age 44 years through age 49 or 35 years with a history of 3 consecutive normal Pap tests.  HPV screening.** / Every 3 years from ages 82 years through ages 35 to 52 years with a history of 3 consecutive normal Pap tests.  Fecal occult blood test (FOBT) of stool. / Every year beginning at age 77 years and continuing until age 66 years. You may not need to do this test if you get a colonoscopy every 10 years.  Flexible sigmoidoscopy or colonoscopy.** / Every 5 years for a flexible sigmoidoscopy or every 10 years for a colonoscopy beginning at age 38 years and continuing until age 43 years.  Hepatitis C blood test.** / For all people born from [birth year] through 1965 and any individual with known risks for hepatitis C.  Skin self-exam. / Monthly.  Influenza vaccine. / Every year.  Tetanus, diphtheria, and  acellular pertussis (Tdap/Td) vaccine.** / Consult your health care provider. Pregnant women should receive 1 dose of Tdap vaccine during each pregnancy. 1 dose of Td every 10 years.  Varicella vaccine.** / Consult your health care provider. Pregnant females who do not have evidence of immunity should receive the first dose after pregnancy.  Zoster vaccine.** / 1 dose for adults aged 22 years or older.  Pneumococcal 13-valent conjugate (PCV13) vaccine.** / Consult your health care provider.  Pneumococcal polysaccharide (PPSV23) vaccine.** / 1 to 2 doses if you smoke cigarettes or if you have certain conditions.  Meningococcal vaccine.** / Consult your health care provider.  Hepatitis A vaccine.** / Consult your health care provider.  Hepatitis B vaccine.** / Consult your health care provider. Screening for abdominal aortic aneurysm (AAA)  by ultrasound is recommended for people over 50 who have history of high blood pressure or who are current or former smokers.

## 2015-05-14 NOTE — Progress Notes (Signed)
Complete Physical  Assessment and Plan: Hypertension-- continue medications, DASH diet, exercise and monitor at home. Call if greater than 130/80.  Depression- currently dealing with some depression from recent trauma but it is normal  Anxiety- take xanax PRN  Allergic rhinitis- Allegra OTC, increase H20, allergy hygiene explained.  Tobacco used 18 years, pack a day- long discussion- willing to try chantix, will wait to start RX, instructions given Get CXR 3D MGM due next year Need Colonoscopy  Discussed med's effects and SE's. Screening labs and tests as requested with regular follow-up as recommended.  HPI 54 y.o. female  presents for a complete physical. She has had elevated blood pressure since 2006. Her blood pressure has been controlled at home, today their BP is BP: 132/90 mmHg She does not workout. She denies chest pain, shortness of breath, dizziness.  She is not on cholesterol medication and denies myalgias. Her cholesterol is at goal. The cholesterol last visit was:   Lab Results  Component Value Date   CHOL 171 05/10/2014   HDL 65 05/10/2014   LDLCALC 94 05/10/2014   TRIG 58 05/10/2014   CHOLHDL 2.6 05/10/2014   Last A1C in the office was:  Lab Results  Component Value Date   HGBA1C 5.6 05/10/2014   Patient is on Vitamin D supplement, 50,000 IU twice a week Lab Results  Component Value Date   VD25OH 50* 05/10/2014   Last year she lost her step daughter to car accident, she had 4 kids, has pretty much raised her but she states she is doing well with her medications and her depression is in remission.  She had post menopausal bleeding thought to be due to prednisone, had negative pelvic US with OB/gYN.  She continue to smoke 1 pack a day, needs CXR.  Married for 25 years, has 2 kids and 3 grand kids  Current Medications:  Current Outpatient Prescriptions on File Prior to Visit  Medication Sig Dispense Refill  . acyclovir (ZOVIRAX) 400 MG tablet TAKE ONE TABLET 3  TIMES A DAY AS NEEDED. 30 tablet 0  . albuterol (PROVENTIL HFA;VENTOLIN HFA) 108 (90 BASE) MCG/ACT inhaler Inhale 2 puffs into the lungs every 6 (six) hours as needed for wheezing or shortness of breath. 1 Inhaler 2  . ALPRAZolam (XANAX) 0.5 MG tablet Take 1 tablet (0.5 mg total) by mouth 3 (three) times daily as needed for anxiety. 90 tablet 2  . amoxicillin-clavulanate (AUGMENTIN) 875-125 MG per tablet Take 1 tablet by mouth 2 (two) times daily. One po bid x 7 days 14 tablet 0  . cetirizine (ZYRTEC) 10 MG tablet Take 10 mg by mouth daily.    . DULoxetine (CYMBALTA) 60 MG capsule Take 1 capsule (60 mg total) by mouth daily. 30 capsule 3  . esomeprazole (NEXIUM) 40 MG capsule Take 1 capsule (40 mg total) by mouth daily. 30 capsule 6  . montelukast (SINGULAIR) 10 MG tablet Take 1 tablet (10 mg total) by mouth daily. 30 tablet 2  . olmesartan-hydrochlorothiazide (BENICAR HCT) 20-12.5 MG per tablet Take 1 tablet by mouth daily. 30 tablet 3  . varenicline (CHANTIX) 1 MG tablet Take 1 tablet (1 mg total) by mouth 2 (two) times daily. 56 tablet 2  . Vitamin D, Ergocalciferol, (DRISDOL) 50000 UNITS CAPS capsule Take 1 capsule (50,000 Units total) by mouth every Monday, Wednesday, and Friday. 12 capsule PRN   No current facility-administered medications on file prior to visit.   Health Maintenance:   Immunization History  Administered Date(s) Administered  .  Tdap 05/09/2013   Tetanus: 2014 Pneumovax: N/A Prevnar 13: N/A Flu vaccine: declines Zostavax: N/A Pap: 12/2014 US pelvis 12/2014 MGM: 12/2014 normal DEXA: N/A Colonoscopy: NEEDS want close to , early AM appointment or late in the after noon EGD: N/A CXR DUE  DEE: May 2016 Dr. Charma Igo Patient Care Team: Unk Pinto, MD as PCP - General (Internal Medicine) Crista Luria, MD as Consulting Physician (Dermatology) Mosetta Anis, MD as Referring Physician (Allergy)   Allergies:  Allergies  Allergen Reactions  .  Azithromycin Nausea And Vomiting  . Prednisone     Post menopausal bleeding   Medical History:  Past Medical History  Diagnosis Date  . Hypertension 2006  . Depression   . Anxiety   . Allergy    Surgical History:  Past Surgical History  Procedure Laterality Date  . Tonsillectomy and adenoidectomy    . Cesarean section      x2   Family History:  Family History  Problem Relation Age of Onset  . Hypertension Father   . Hyperlipidemia Father   . Heart attack Father    Social History:  History  Substance Use Topics  . Smoking status: Current Every Day Smoker -- 1.00 packs/day    Types: Cigarettes  . Smokeless tobacco: Not on file  . Alcohol Use: 3.6 oz/week    6 Cans of beer per week     Comment: nightly for months   ROS   Physical Exam: Estimated body mass index is 29.18 kg/(m^2) as calculated from the following:   Height as of this encounter: 5\' 2"  (1.575 m).   Weight as of this encounter: 159 lb 9.6 oz (72.394 kg). BP 132/90 mmHg  Pulse 84  Temp(Src) 97.9 F (36.6 C)  Resp 16  Ht 5\' 2"  (1.575 m)  Wt 159 lb 9.6 oz (72.394 kg)  BMI 29.18 kg/m2  LMP 05/30/2013 General Appearance: Well nourished, in no apparent distress. Eyes: PERRLA, EOMs, conjunctiva no swelling or erythema, normal fundi and vessels. Sinuses: No Frontal/maxillary tenderness ENT/Mouth: Ext aud canals clear, normal light reflex with TMs without erythema, bulging.  Good dentition. No erythema, swelling, or exudate on post pharynx. Tonsils not swollen or erythematous. Hearing normal.  Neck: Supple, thyroid normal. No bruits Respiratory: Respiratory effort normal, BS equal bilaterally without rales, rhonchi, wheezing or stridor. Cardio: RRR without murmurs, rubs or gallops. Brisk peripheral pulses without edema.  Chest: symmetric, with normal excursions and percussion. Breasts: Symmetric, + FBD, without nipple discharge, retractions. Abdomen: Soft, +BS. Non tender, no guarding, rebound, hernias,  masses, or organomegaly. .  Lymphatics: Non tender without lymphadenopathy.  Genitourinary: defer Musculoskeletal: Full ROM all peripheral extremities,5/5 strength, and normal gait. Skin: Warm, dry without rashes, lesions, ecchymosis.  Neuro: Cranial nerves intact, reflexes equal bilaterally. Normal muscle tone, no cerebellar symptoms. Sensation intact.  Psych: Awake and oriented X 3, normal affect, Insight and Judgment appropriate.   EKG: WNL no changes. AORTA SCAN: WNL    Vicie Mutters 9:09 AM

## 2015-05-15 LAB — URINALYSIS, ROUTINE W REFLEX MICROSCOPIC
Bilirubin Urine: NEGATIVE
GLUCOSE, UA: NEGATIVE mg/dL
Hgb urine dipstick: NEGATIVE
KETONES UR: NEGATIVE mg/dL
Leukocytes, UA: NEGATIVE
Nitrite: NEGATIVE
PH: 6.5 (ref 5.0–8.0)
Protein, ur: NEGATIVE mg/dL
Specific Gravity, Urine: <1.005 — ABNORMAL LOW (ref 1.005–1.030)
Urobilinogen, UA: 0.2 mg/dL (ref 0.0–1.0)

## 2015-05-15 LAB — HEPATIC FUNCTION PANEL
ALK PHOS: 79 U/L (ref 39–117)
ALT: 14 U/L (ref 0–35)
AST: 19 U/L (ref 0–37)
Albumin: 4.5 g/dL (ref 3.5–5.2)
Bilirubin, Direct: 0.1 mg/dL (ref 0.0–0.3)
Indirect Bilirubin: 0.2 mg/dL (ref 0.2–1.2)
Total Bilirubin: 0.3 mg/dL (ref 0.2–1.2)
Total Protein: 7.2 g/dL (ref 6.0–8.3)

## 2015-05-15 LAB — FERRITIN: Ferritin: 35 ng/mL (ref 10–291)

## 2015-05-15 LAB — LIPID PANEL
CHOLESTEROL: 203 mg/dL — AB (ref 0–200)
HDL: 80 mg/dL (ref >=46)
LDL CALC: 107 mg/dL — AB (ref 0–99)
TRIGLYCERIDES: 78 mg/dL (ref <150)
Total CHOL/HDL Ratio: 2.5 Ratio
VLDL: 16 mg/dL (ref 0–40)

## 2015-05-15 LAB — IRON AND TIBC
%SAT: 19 % — AB (ref 20–55)
IRON: 74 ug/dL (ref 42–145)
TIBC: 389 ug/dL (ref 250–470)
UIBC: 315 ug/dL (ref 125–400)

## 2015-05-15 LAB — MICROALBUMIN / CREATININE URINE RATIO: CREATININE, URINE: 22.7 mg/dL

## 2015-05-15 LAB — TSH: TSH: 2.12 u[IU]/mL (ref 0.350–4.500)

## 2015-05-15 LAB — VITAMIN B12: VITAMIN B 12: 490 pg/mL (ref 211–911)

## 2015-05-15 LAB — BASIC METABOLIC PANEL WITH GFR
BUN: 12 mg/dL (ref 6–23)
CHLORIDE: 102 meq/L (ref 96–112)
CO2: 27 mEq/L (ref 19–32)
CREATININE: 0.76 mg/dL (ref 0.50–1.10)
Calcium: 10.3 mg/dL (ref 8.4–10.5)
GFR, Est African American: >89 mL/min
GFR, Est Non African American: 89 mL/min
GLUCOSE: 91 mg/dL (ref 70–99)
Potassium: 4.8 mEq/L (ref 3.5–5.3)
SODIUM: 139 meq/L (ref 135–145)

## 2015-05-15 LAB — MAGNESIUM: Magnesium: 2.1 mg/dL (ref 1.5–2.5)

## 2015-05-15 LAB — INSULIN, FASTING: INSULIN FASTING, SERUM: 9.3 u[IU]/mL (ref 2.0–19.6)

## 2015-05-15 LAB — VITAMIN D 25 HYDROXY (VIT D DEFICIENCY, FRACTURES): Vit D, 25-Hydroxy: 69 ng/mL (ref 30–100)

## 2015-05-18 ENCOUNTER — Encounter: Payer: Self-pay | Admitting: Physician Assistant

## 2015-07-04 ENCOUNTER — Other Ambulatory Visit: Payer: Self-pay | Admitting: Physician Assistant

## 2015-07-04 MED ORDER — ALPRAZOLAM 0.5 MG PO TABS: 0.5000 mg | ORAL_TABLET | Freq: Three times a day (TID) | ORAL | Status: DC | PRN

## 2015-07-04 NOTE — Progress Notes (Signed)
Called Rx into [facility].

## 2015-08-23 ENCOUNTER — Other Ambulatory Visit: Payer: Self-pay

## 2015-08-23 MED ORDER — DULOXETINE HCL 60 MG PO CPEP: 60.0000 mg | ORAL_CAPSULE | Freq: Every day | ORAL | Status: DC

## 2015-09-17 ENCOUNTER — Ambulatory Visit (INDEPENDENT_AMBULATORY_CARE_PROVIDER_SITE_OTHER): Payer: 59 | Admitting: Internal Medicine

## 2015-09-17 ENCOUNTER — Encounter: Payer: Self-pay | Admitting: Internal Medicine

## 2015-09-17 VITALS — BP 122/84 | HR 97 | Temp 97.7°F | Resp 16 | Ht 62.0 in | Wt 162.2 lb

## 2015-09-17 DIAGNOSIS — J069 Acute upper respiratory infection, unspecified: Secondary | ICD-10-CM

## 2015-09-17 MED ORDER — IPRATROPIUM BROMIDE 0.03 % NA SOLN: 2.0000 | Freq: Two times a day (BID) | NASAL | Status: DC

## 2015-09-17 MED ORDER — MONTELUKAST SODIUM 10 MG PO TABS: 10.0000 mg | ORAL_TABLET | Freq: Every day | ORAL | Status: DC

## 2015-09-17 MED ORDER — DOXYCYCLINE HYCLATE 100 MG PO CAPS: 100.0000 mg | ORAL_CAPSULE | Freq: Two times a day (BID) | ORAL | Status: DC

## 2015-09-17 MED ORDER — NEOMYCIN-POLYMYXIN-HC 3.5-10000-1 OT SUSP: 4.0000 [drp] | Freq: Three times a day (TID) | OTIC | Status: DC

## 2015-09-17 NOTE — Progress Notes (Signed)
Patient ID: Sylvia Mclean, female   DOB: [birth year], 54 y.o.   MRN: [id number]  HPI  Patient presents to the office for evaluation of cough.  It has been going on for 4 days.  Patient reports night > day, dry, worse with lying down.  They also endorse change in voice, postnasal drip and sinus pressure, right sided ear pain, sore throat, hoarseness.  .  They have tried mucinex and advil.  They report that nothing has worked.  They admits to other sick contacts.  She reports that daughter and granddaughter has very similar symptoms.   Review of Systems  Constitutional: Negative for fever, chills and malaise/fatigue.  HENT: Positive for congestion, ear pain and sore throat.   Respiratory: Negative for cough, shortness of breath and wheezing.   Cardiovascular: Negative for chest pain, palpitations and leg swelling.  Skin: Negative.   Neurological: Negative for headaches.    PE:  Filed Vitals:   09/17/15 0910  BP: 122/84  Pulse: 97  Temp: 97.7 F (36.5 C)  Resp: 16   General:  Alert and non-toxic, WDWN, NAD HEENT: NCAT, PERLA, EOM normal, no occular discharge or erythema.  Nasal mucosal edema with sinus tenderness to palpation.  Oropharynx clear with minimal oropharyngeal edema and erythema.  Mucous membranes moist and pink. Neck:  Cervical adenopathy Chest:  RRR no MRGs.  Lungs clear to auscultation A&P with no wheezes rhonchi or rales.   Abdomen: +BS x 4 quadrants, soft, non-tender, no guarding, rigidity, or rebound. Skin: warm and dry no rash Neuro: A&Ox4, CN II-XII grossly intact  Assessment and Plan:    1. Acute URI -doxycycline -atrovent -flonase -singulair -nasal saline -zyrtec

## 2015-09-17 NOTE — Patient Instructions (Signed)
Please continue to take zyrtec daily.  Please start taking singulair daily  Please use flonase nightly 2 sprays per each nostril.  Please use saline in your nose as often as you can tolerate.  Please use atrovent up to twice daily to help with nasal congestion.  If you have no improvement please take the antibiotic in 4 days.  Please use the ear drops 3 times daily.  Avoid full submersion in water.  Don't stick anything in your ears.

## 2015-10-23 ENCOUNTER — Other Ambulatory Visit: Payer: Self-pay | Admitting: Physician Assistant

## 2015-10-23 MED ORDER — ALPRAZOLAM 0.5 MG PO TABS: 0.5000 mg | ORAL_TABLET | Freq: Three times a day (TID) | ORAL | Status: DC | PRN

## 2015-10-23 NOTE — Progress Notes (Signed)
Rx called into [facility].

## 2015-10-25 ENCOUNTER — Encounter: Payer: Self-pay | Admitting: Physician Assistant

## 2015-10-25 ENCOUNTER — Other Ambulatory Visit: Payer: Self-pay | Admitting: Physician Assistant

## 2015-10-25 MED ORDER — PROMETHAZINE-DM 6.25-15 MG/5ML PO SYRP: ORAL_SOLUTION | ORAL | Status: DC

## 2015-10-25 MED ORDER — AMOXICILLIN-POT CLAVULANATE 875-125 MG PO TABS: 1.0000 | ORAL_TABLET | Freq: Two times a day (BID) | ORAL | Status: DC

## 2015-11-02 ENCOUNTER — Other Ambulatory Visit: Payer: Self-pay

## 2015-11-02 MED ORDER — OLMESARTAN MEDOXOMIL-HCTZ 20-12.5 MG PO TABS: 1.0000 | ORAL_TABLET | Freq: Every day | ORAL | Status: DC

## 2015-11-16 ENCOUNTER — Encounter: Payer: Self-pay | Admitting: Physician Assistant

## 2015-11-16 ENCOUNTER — Ambulatory Visit (INDEPENDENT_AMBULATORY_CARE_PROVIDER_SITE_OTHER): Payer: 59 | Admitting: Physician Assistant

## 2015-11-16 VITALS — BP 120/80 | HR 93 | Temp 97.9°F | Resp 16 | Ht 62.0 in | Wt 158.0 lb

## 2015-11-16 DIAGNOSIS — F32A Depression, unspecified: Secondary | ICD-10-CM

## 2015-11-16 DIAGNOSIS — Z1159 Encounter for screening for other viral diseases: Secondary | ICD-10-CM

## 2015-11-16 DIAGNOSIS — F329 Major depressive disorder, single episode, unspecified: Secondary | ICD-10-CM

## 2015-11-16 DIAGNOSIS — E785 Hyperlipidemia, unspecified: Secondary | ICD-10-CM

## 2015-11-16 DIAGNOSIS — I10 Essential (primary) hypertension: Secondary | ICD-10-CM | POA: Diagnosis not present

## 2015-11-16 DIAGNOSIS — F17200 Nicotine dependence, unspecified, uncomplicated: Secondary | ICD-10-CM | POA: Diagnosis not present

## 2015-11-16 LAB — BASIC METABOLIC PANEL WITH GFR
BUN: 10 mg/dL (ref 7–25)
CALCIUM: 9.5 mg/dL (ref 8.6–10.4)
CO2: 28 mmol/L (ref 20–31)
Chloride: 98 mmol/L (ref 98–110)
Creat: 0.75 mg/dL (ref 0.50–1.05)
GFR, Est Non African American: >89 mL/min (ref >=60)
GLUCOSE: 85 mg/dL (ref 65–99)
POTASSIUM: 4.5 mmol/L (ref 3.5–5.3)
Sodium: 135 mmol/L (ref 135–146)

## 2015-11-16 LAB — CBC WITH DIFFERENTIAL/PLATELET
BASOS ABS: 0 10*3/uL (ref 0.0–0.1)
BASOS PCT: 0 % (ref 0–1)
EOS ABS: 0.1 10*3/uL (ref 0.0–0.7)
Eosinophils Relative: 2 % (ref 0–5)
HCT: 46.4 % — ABNORMAL HIGH (ref 36.0–46.0)
Hemoglobin: 15.8 g/dL — ABNORMAL HIGH (ref 12.0–15.0)
LYMPHS ABS: 2.1 10*3/uL (ref 0.7–4.0)
Lymphocytes Relative: 36 % (ref 12–46)
MCH: 31.4 pg (ref 26.0–34.0)
MCHC: 34.1 g/dL (ref 30.0–36.0)
MCV: 92.2 fL (ref 78.0–100.0)
MPV: 9 fL (ref 8.6–12.4)
Monocytes Absolute: 0.3 10*3/uL (ref 0.1–1.0)
Monocytes Relative: 6 % (ref 3–12)
NEUTROS PCT: 56 % (ref 43–77)
Neutro Abs: 3.2 10*3/uL (ref 1.7–7.7)
PLATELETS: 267 10*3/uL (ref 150–400)
RBC: 5.03 MIL/uL (ref 3.87–5.11)
RDW: 13.4 % (ref 11.5–15.5)
WBC: 5.8 10*3/uL (ref 4.0–10.5)

## 2015-11-16 LAB — LIPID PANEL
Cholesterol: 235 mg/dL — ABNORMAL HIGH (ref 125–200)
HDL: 88 mg/dL (ref >=46)
LDL CALC: 125 mg/dL (ref <130)
TRIGLYCERIDES: 110 mg/dL (ref <150)
Total CHOL/HDL Ratio: 2.7 Ratio (ref <=5.0)
VLDL: 22 mg/dL (ref <30)

## 2015-11-16 LAB — TSH: TSH: 2.252 u[IU]/mL (ref 0.350–4.500)

## 2015-11-16 LAB — HEPATIC FUNCTION PANEL
ALBUMIN: 4.7 g/dL (ref 3.6–5.1)
ALK PHOS: 82 U/L (ref 33–130)
ALT: 14 U/L (ref 6–29)
AST: 21 U/L (ref 10–35)
BILIRUBIN INDIRECT: 0.6 mg/dL (ref 0.2–1.2)
Bilirubin, Direct: 0.1 mg/dL (ref <=0.2)
TOTAL PROTEIN: 7.5 g/dL (ref 6.1–8.1)
Total Bilirubin: 0.7 mg/dL (ref 0.2–1.2)

## 2015-11-16 LAB — HIV ANTIBODY (ROUTINE TESTING W REFLEX): HIV 1&2 Ab, 4th Generation: NONREACTIVE

## 2015-11-16 LAB — HEPATITIS C ANTIBODY: HCV AB: NEGATIVE

## 2015-11-16 MED ORDER — TRIAMCINOLONE ACETONIDE 0.5 % EX CREA: 1.0000 "application " | TOPICAL_CREAM | Freq: Two times a day (BID) | CUTANEOUS | Status: DC

## 2015-11-16 NOTE — Patient Instructions (Signed)
If you have a smart phone, please look up Smoke Free app, this will help you stay on track and give you information about money you have saved, life that you have gained back and a ton of more information.   We are giving you chantix for smoking cessation. You can do it! And we are here to help! You may have heard some scary side effects about chantix, the three most common I hear about are nausea, crazy dreams and depression.  However, I like for my patients to try to stay on 1/2 a tablet twice a day rather than one tablet twice a day as normally prescribed. This helps decrease the chances of side effects and helps save money by making a one month prescription last two months  Please start the prescription this way:  Start 1/2 tablet by mouth once daily after food with a full glass of water for 3 days Then do 1/2 tablet by mouth twice daily for 4 days. During this first week you can smoke, but try to stop after this week.  At this point we have several options: 1) continue on 1/2 tablet twice a day- which I encourage you to do. You can stay on this dose the rest of the time on the medication or if you still feel the need to smoke you can do one of the two options below. 2) do one tablet in the morning and 1/2 in the evening which helps decrease dreams. 3) do one tablet twice a day.   What if I miss a dose? If you miss a dose, take it as soon as you can. If it is almost time for your next dose, take only that dose. Do not take double or extra doses.  What should I watch for while using this medicine? Visit your doctor or health care professional for regular check ups. Ask for ongoing advice and encouragement from your doctor or healthcare professional, friends, and family to help you quit. If you smoke while on this medication, quit again  Your mouth may get dry. Chewing sugarless gum or hard candy, and drinking plenty of water may help. Contact your doctor if the problem does not go away or is  severe.  You may get drowsy or dizzy. Do not drive, use machinery, or do anything that needs mental alertness until you know how this medicine affects you. Do not stand or sit up quickly, especially if you are an older patient.   The use of this medicine may increase the chance of suicidal thoughts or actions. Pay special attention to how you are responding while on this medicine. Any worsening of mood, or thoughts of suicide or dying should be reported to your health care professional right away.  ADVANTAGES OF QUITTING SMOKING  Within 20 minutes, blood pressure decreases. Your pulse is at normal level.  After 8 hours, carbon monoxide levels in the blood return to normal. Your oxygen level increases.  After 24 hours, the chance of having a heart attack starts to decrease. Your breath, hair, and body stop smelling like smoke.  After 48 hours, damaged nerve endings begin to recover. Your sense of taste and smell improve.  After 72 hours, the body is virtually free of nicotine. Your bronchial tubes relax and breathing becomes easier.  After 2 to 12 weeks, lungs can hold more air. Exercise becomes easier and circulation improves.  After 1 year, the risk of coronary heart disease is cut in half.  After 5 years,   the risk of stroke falls to the same as a nonsmoker.  After 10 years, the risk of lung cancer is cut in half and the risk of other cancers decreases significantly.  After 15 years, the risk of coronary heart disease drops, usually to the level of a nonsmoker.  You will have extra money to spend on things other than cigarettes.  

## 2015-11-16 NOTE — Progress Notes (Signed)
Assessment and Plan:  Hypertension: Continue medication, monitor blood pressure at home. Continue DASH diet.  Reminder to go to the ER if any CP, SOB, nausea, dizziness, severe HA, changes vision/speech, left arm numbness and tingling, and jaw pain. Cholesterol: Continue diet and exercise. Check cholesterol.  Vitamin D Def- check level and continue medications.  Smoking cessation-  counseled patient on the dangers of tobacco use, advised patient to stop smoking, and reviewed strategies to maximize success, get CXR Still needs colonoscopy, will discuss again at [facility] and meds as discussed. Future Appointments Date Time Provider [place]  05/14/2016 9:00 AM Vicie Mutters, PA-C [id number] None    HPI 55 y.o. female  presents for 3 month follow up with hypertension, hyperlipidemia, prediabetes and vitamin D. Her blood pressure has been controlled at home, today their BP is BP: 120/80 mmHg She does workout but sporadic She denies chest pain, shortness of breath, dizziness.  She is not on cholesterol medication and denies myalgias. Her cholesterol is at goal. The cholesterol last visit was:   Lab Results  Component Value Date   CHOL 203* 05/14/2015   HDL 80 05/14/2015   LDLCALC 107* 05/14/2015   TRIG 78 05/14/2015   CHOLHDL 2.5 05/14/2015  She has been working on diet and exercise for prediabetes, and denies paresthesia of the feet, polydipsia, polyuria and visual disturbances. Last A1C in the office was:  Lab Results  Component Value Date   HGBA1C 5.5 05/14/2015  Patient is on Vitamin D supplement, taking it 3 days a week will start to take it 2 days a week.   Lab Results  Component Value Date   VD25OH 69 05/14/2015   First holiday without her step daughter who passed and her birthday was in November so it was hard on her and her husband.   He continues to smoke and is not interested in quitting. She is on cymbalta and xanax for depression and xaanx   Current  Medications:  Current Outpatient Prescriptions on File Prior to Visit  Medication Sig Dispense Refill  . acyclovir (ZOVIRAX) 400 MG tablet TAKE ONE TABLET 3 TIMES A DAY AS NEEDED. 30 tablet 0  . albuterol (PROVENTIL HFA;VENTOLIN HFA) 108 (90 BASE) MCG/ACT inhaler Inhale 2 puffs into the lungs every 6 (six) hours as needed for wheezing or shortness of breath. 1 Inhaler 2  . ALPRAZolam (XANAX) 0.5 MG tablet Take 1 tablet (0.5 mg total) by mouth 3 (three) times daily as needed for anxiety. 90 tablet 0  . cetirizine (ZYRTEC) 10 MG tablet Take 10 mg by mouth daily.    . DULoxetine (CYMBALTA) 60 MG capsule Take 1 capsule (60 mg total) by mouth daily. 30 capsule 3  . esomeprazole (NEXIUM) 40 MG capsule Take 1 capsule (40 mg total) by mouth daily. 30 capsule 6  . ipratropium (ATROVENT) 0.03 % nasal spray Place 2 sprays into both nostrils every 12 (twelve) hours. 30 mL 12  . olmesartan-hydrochlorothiazide (BENICAR HCT) 20-12.5 MG tablet Take 1 tablet by mouth daily. 30 tablet 3  . Vitamin D, Ergocalciferol, (DRISDOL) 50000 UNITS CAPS capsule Take 1 capsule (50,000 Units total) by mouth every Monday, Wednesday, and Friday. 12 capsule PRN   No current facility-administered medications on file prior to visit.   Medical History:  Past Medical History  Diagnosis Date  . Hypertension 2006  . Depression   . Anxiety   . Allergy    Allergies:  Allergies  Allergen Reactions  . Azithromycin Nausea  And Vomiting  . Prednisone     Post menopausal bleeding    Review of Systems:  Review of Systems  Constitutional: Negative.   HENT: Negative.   Eyes: Negative.   Respiratory: Negative.   Cardiovascular: Negative.   Gastrointestinal: Negative.   Genitourinary: Negative.   Musculoskeletal: Negative.   Skin: Negative.   Neurological: Negative.   Endo/Heme/Allergies: Negative.   Psychiatric/Behavioral: Negative.      Family history- Review and unchanged Social history- Review and unchanged Physical  Exam: BP 120/80 mmHg  Pulse 93  Temp(Src) 97.9 F (36.6 C) (Temporal)  Resp 16  Ht 5\' 2"  (1.575 m)  Wt 158 lb (71.668 kg)  BMI 28.89 kg/m2  SpO2 97%  LMP 05/30/2013 Wt Readings from Last 3 Encounters:  11/16/15 158 lb (71.668 kg)  09/17/15 162 lb 3.2 oz (73.573 kg)  05/14/15 159 lb 9.6 oz (72.394 kg)   General Appearance: Well nourished, in no apparent distress. Eyes: PERRLA, EOMs, conjunctiva no swelling or erythema Sinuses: No Frontal/maxillary tenderness ENT/Mouth: Ext aud canals clear, TMs without erythema, bulging. No erythema, swelling, or exudate on post pharynx.  Tonsils not swollen or erythematous. Hearing normal.  Neck: Supple, thyroid normal.  Respiratory: Respiratory effort normal, BS equal bilaterally without rales, rhonchi, wheezing or stridor.  Cardio: RRR with no MRGs. Brisk peripheral pulses without edema.  Abdomen: Soft, + BS.  Non tender, no guarding, rebound, hernias, masses. Lymphatics: Non tender without lymphadenopathy.  Musculoskeletal: Full ROM, 5/5 strength, normal gait.  Skin: Warm, dry without rashes, lesions, ecchymosis.  Neuro: Cranial nerves intact. Normal muscle tone, no cerebellar symptoms. Sensation intact.  Psych: Awake and oriented X 3, normal affect, Insight and Judgment appropriate.    Vicie Mutters, PA-C 9:03 AM [facility]

## 2015-11-26 ENCOUNTER — Other Ambulatory Visit: Payer: Self-pay | Admitting: Physician Assistant

## 2015-11-26 MED ORDER — ALPRAZOLAM 0.5 MG PO TABS: 0.5000 mg | ORAL_TABLET | Freq: Three times a day (TID) | ORAL | Status: DC | PRN

## 2015-11-26 NOTE — Progress Notes (Signed)
RX CALLED INTO PHARMACY

## 2016-01-02 ENCOUNTER — Other Ambulatory Visit: Payer: Self-pay

## 2016-01-02 MED ORDER — DULOXETINE HCL 60 MG PO CPEP: 60.0000 mg | ORAL_CAPSULE | Freq: Every day | ORAL | Status: DC

## 2016-02-04 ENCOUNTER — Other Ambulatory Visit: Payer: Self-pay | Admitting: Physician Assistant

## 2016-02-04 MED ORDER — ALPRAZOLAM 0.5 MG PO TABS: 0.5000 mg | ORAL_TABLET | Freq: Three times a day (TID) | ORAL | Status: DC | PRN

## 2016-02-04 NOTE — Progress Notes (Signed)
RX CALLED INTO [facility] [place]

## 2016-03-25 ENCOUNTER — Other Ambulatory Visit: Payer: Self-pay

## 2016-03-25 MED ORDER — OLMESARTAN MEDOXOMIL-HCTZ 20-12.5 MG PO TABS: 1.0000 | ORAL_TABLET | Freq: Every day | ORAL | Status: DC

## 2016-04-10 ENCOUNTER — Other Ambulatory Visit: Payer: Self-pay | Admitting: Physician Assistant

## 2016-04-10 MED ORDER — ALPRAZOLAM 0.5 MG PO TABS
0.5000 mg | ORAL_TABLET | Freq: Three times a day (TID) | ORAL | Status: DC | PRN
Start: 2016-04-10 — End: 2016-05-16

## 2016-04-10 NOTE — Progress Notes (Signed)
Rx called into [facility].

## 2016-04-24 ENCOUNTER — Other Ambulatory Visit: Payer: Self-pay

## 2016-04-24 MED ORDER — OLMESARTAN MEDOXOMIL-HCTZ 20-12.5 MG PO TABS: 1.0000 | ORAL_TABLET | Freq: Every day | ORAL | Status: DC

## 2016-05-14 ENCOUNTER — Ambulatory Visit (INDEPENDENT_AMBULATORY_CARE_PROVIDER_SITE_OTHER): Payer: 59 | Admitting: Physician Assistant

## 2016-05-14 ENCOUNTER — Encounter: Payer: Self-pay | Admitting: Physician Assistant

## 2016-05-14 VITALS — BP 122/70 | HR 75 | Temp 98.1°F | Resp 16 | Ht 61.5 in | Wt 157.8 lb

## 2016-05-14 DIAGNOSIS — I10 Essential (primary) hypertension: Secondary | ICD-10-CM

## 2016-05-14 DIAGNOSIS — F419 Anxiety disorder, unspecified: Secondary | ICD-10-CM

## 2016-05-14 DIAGNOSIS — E559 Vitamin D deficiency, unspecified: Secondary | ICD-10-CM

## 2016-05-14 DIAGNOSIS — T7840XD Allergy, unspecified, subsequent encounter: Secondary | ICD-10-CM

## 2016-05-14 DIAGNOSIS — E785 Hyperlipidemia, unspecified: Secondary | ICD-10-CM | POA: Insufficient documentation

## 2016-05-14 DIAGNOSIS — D179 Benign lipomatous neoplasm, unspecified: Secondary | ICD-10-CM

## 2016-05-14 DIAGNOSIS — K219 Gastro-esophageal reflux disease without esophagitis: Secondary | ICD-10-CM

## 2016-05-14 DIAGNOSIS — R6889 Other general symptoms and signs: Secondary | ICD-10-CM

## 2016-05-14 DIAGNOSIS — Z8619 Personal history of other infectious and parasitic diseases: Secondary | ICD-10-CM

## 2016-05-14 DIAGNOSIS — Z79899 Other long term (current) drug therapy: Secondary | ICD-10-CM

## 2016-05-14 DIAGNOSIS — Z0001 Encounter for general adult medical examination with abnormal findings: Secondary | ICD-10-CM

## 2016-05-14 DIAGNOSIS — F17200 Nicotine dependence, unspecified, uncomplicated: Secondary | ICD-10-CM

## 2016-05-14 DIAGNOSIS — F325 Major depressive disorder, single episode, in full remission: Secondary | ICD-10-CM

## 2016-05-14 LAB — CBC WITH DIFFERENTIAL/PLATELET
BASOS PCT: 1 %
Basophils Absolute: 65 cells/uL (ref 0–200)
Eosinophils Absolute: 130 cells/uL (ref 15–500)
Eosinophils Relative: 2 %
HEMATOCRIT: 50.2 % — AB (ref 35.0–45.0)
Hemoglobin: 17.3 g/dL — ABNORMAL HIGH (ref 11.7–15.5)
LYMPHS ABS: 2340 {cells}/uL (ref 850–3900)
LYMPHS PCT: 36 %
MCH: 32.8 pg (ref 27.0–33.0)
MCHC: 34.5 g/dL (ref 32.0–36.0)
MCV: 95.3 fL (ref 80.0–100.0)
MONO ABS: 520 {cells}/uL (ref 200–950)
MPV: 9.2 fL (ref 7.5–12.5)
Monocytes Relative: 8 %
Neutro Abs: 3445 cells/uL (ref 1500–7800)
Neutrophils Relative %: 53 %
Platelets: 288 10*3/uL (ref 140–400)
RBC: 5.27 MIL/uL — AB (ref 3.80–5.10)
RDW: 14.1 % (ref 11.0–15.0)
WBC: 6.5 10*3/uL (ref 3.8–10.8)

## 2016-05-14 LAB — TSH: TSH: 1.46 mIU/L

## 2016-05-14 MED ORDER — ACYCLOVIR 400 MG PO TABS: ORAL_TABLET | ORAL | Status: DC

## 2016-05-14 MED ORDER — OLMESARTAN MEDOXOMIL-HCTZ 20-12.5 MG PO TABS: 1.0000 | ORAL_TABLET | Freq: Every day | ORAL | Status: DC

## 2016-05-14 MED ORDER — DULOXETINE HCL 60 MG PO CPEP: 60.0000 mg | ORAL_CAPSULE | Freq: Every day | ORAL | Status: DC

## 2016-05-14 NOTE — Progress Notes (Signed)
Complete Physical  Assessment and Plan: 1. Essential hypertension - continue medications, DASH diet, exercise and monitor at home. Call if greater than 130/80.  - CBC with Differential/Platelet - BASIC METABOLIC PANEL WITH GFR - Hepatic function panel - TSH - Urinalysis, Routine w reflex microscopic (not at [facility]) - Microalbumin / creatinine urine ratio - DG Chest 2 View; Future - EKG 12-Lead - olmesartan-hydrochlorothiazide (BENICAR HCT) 20-12.5 MG tablet; Take 1 tablet by mouth daily.  Dispense: 30 tablet; Refill: 5  2. Depression, major, in remission ([place]) Depression- continue medications, stress management techniques discussed, increase water, good sleep hygiene discussed, increase exercise, and increase veggies.  - DULoxetine (CYMBALTA) 60 MG capsule; Take 1 capsule (60 mg total) by mouth daily.  Dispense: 30 capsule; Refill: 5  3. Tobacco use disorder Advised to stop smoking, will get CXR, not ready to quit at this time.  - DG Chest 2 View; Future - EKG 12-Lead  4. Anxiety Continue xanax PRN, discussed addictive nature and that patient should limit use.   5. Gastroesophageal reflux disease, esophagitis presence not specified Continue PPI/H2 blocker, diet discussed  6. Allergy, subsequent encounter Continue OTC meds  7. Hyperlipidemia -continue medications, check lipids, decrease fatty foods, increase activity.  - Lipid panel  8. Vitamin D deficiency - VITAMIN D 25 Hydroxy (Vit-D Deficiency, Fractures)  9. Medication management - Magnesium  10. Encounter for general adult medical examination with abnormal findings Needs COLONOSCOPY Get MGM - CBC with Differential/Platelet - BASIC METABOLIC PANEL WITH GFR - Hepatic function panel - TSH - Lipid panel - Magnesium - VITAMIN D 25 Hydroxy (Vit-D Deficiency, Fractures) - Urinalysis, Routine w reflex microscopic (not at [facility]) - Microalbumin / creatinine urine ratio - DG Chest 2 View; Future - EKG 12-Lead  11.  Lipoma Schedule removal  12. H/O cold sores - acyclovir (ZOVIRAX) 400 MG tablet; TAKE ONE TABLET 3 TIMES A DAY AS NEEDED.  Dispense: 30 tablet; Refill: 2  Discussed med's effects and SE's. Screening labs and tests as requested with regular follow-up as recommended.  HPI 55 y.o. female  presents for a complete physical. She has had elevated blood pressure since 2006. Her blood pressure has been controlled at home, today their BP is BP: 122/70 mmHg She does not workout. She denies chest pain, shortness of breath, dizziness.  She is not on cholesterol medication and denies myalgias. Her cholesterol is at goal. The cholesterol last visit was:   Lab Results  Component Value Date   CHOL 235* 11/16/2015   HDL 88 11/16/2015   LDLCALC 125 11/16/2015   TRIG 110 11/16/2015   CHOLHDL 2.7 11/16/2015   Last A1C in the office was:  Lab Results  Component Value Date   HGBA1C 5.5 05/14/2015   Patient was on Vitamin D supplement has not been on it.  Lab Results  Component Value Date   VD25OH 68 05/14/2015   Lost step daughter to car accident in 2016 , she had 4 kids, was hard on her and her husband but she states her depression is in remission with medications. She will take cymbalta and will take 1-3 a day of the xanax pending on stress.    She continue to smoke 1 pack a day, needs CXR.  Married for 25 years, has 2 kids and 3 grand kids BMI is Body mass index is 29.34 kg/(m^2)., she is working on diet and exercise. Wt Readings from Last 3 Encounters:  05/14/16 157 lb 12.8 oz (71.578 kg)  11/16/15 158 lb (  71.668 kg)  09/17/15 162 lb 3.2 oz (73.573 kg)    Current Medications:  Current Outpatient Prescriptions on File Prior to Visit  Medication Sig Dispense Refill  . acyclovir (ZOVIRAX) 400 MG tablet TAKE ONE TABLET 3 TIMES A DAY AS NEEDED. 30 tablet 0  . albuterol (PROVENTIL HFA;VENTOLIN HFA) 108 (90 BASE) MCG/ACT inhaler Inhale 2 puffs into the lungs every 6 (six) hours as needed for  wheezing or shortness of breath. 1 Inhaler 2  . ALPRAZolam (XANAX) 0.5 MG tablet Take 1 tablet (0.5 mg total) by mouth 3 (three) times daily as needed for anxiety. 90 tablet 0  . cetirizine (ZYRTEC) 10 MG tablet Take 10 mg by mouth daily.    . DULoxetine (CYMBALTA) 60 MG capsule Take 1 capsule (60 mg total) by mouth daily. 30 capsule 3  . esomeprazole (NEXIUM) 40 MG capsule Take 1 capsule (40 mg total) by mouth daily. 30 capsule 6  . ipratropium (ATROVENT) 0.03 % nasal spray Place 2 sprays into both nostrils every 12 (twelve) hours. 30 mL 12  . olmesartan-hydrochlorothiazide (BENICAR HCT) 20-12.5 MG tablet Take 1 tablet by mouth daily. 30 tablet 0  . triamcinolone cream (KENALOG) 0.5 % Apply 1 application topically 2 (two) times daily. 80 g 2   No current facility-administered medications on file prior to visit.   Health Maintenance:   Immunization History  Administered Date(s) Administered  . Tdap 05/09/2013   Tetanus: 2014 Pneumovax: N/A Prevnar 13: N/A Flu vaccine: declines Zostavax: N/A  Pap: 12/2014 US pelvis 12/2014 MGM: 12/2014 normal, DUE DEXA: N/A Colonoscopy: NEEDS want close to [place] [facility], early AM appointment or late in the after noon EGD: N/A CXR DUE, will get today  DEE: May 2016 Dr. Charma Igo Patient Care Team: Unk Pinto, MD as PCP - General (Internal Medicine) Crista Luria, MD as Consulting Physician (Dermatology) Mosetta Anis, MD as Referring Physician (Allergy)   Problem List has Hypertension; Depression; Anxiety; Allergy; GERD (gastroesophageal reflux disease); and Tobacco use disorder on her problem list.  Allergies Allergies  Allergen Reactions  . Azithromycin Nausea And Vomiting  . Prednisone     Post menopausal bleeding    SURGICAL HISTORY She  has past surgical history that includes Tonsillectomy and adenoidectomy and Cesarean section. FAMILY HISTORY Her family history includes Heart attack in her father; Hyperlipidemia in her  father; Hypertension in her father. SOCIAL HISTORY She  reports that she has been smoking Cigarettes.  She has been smoking about 1.00 pack per day. She does not have any smokeless tobacco history on file. She reports that she drinks about 3.6 oz of alcohol per week. She reports that she does not use illicit drugs.  Review of Systems  Constitutional: Negative.   HENT: Negative.   Eyes: Negative.   Respiratory: Negative.   Cardiovascular: Negative.   Gastrointestinal: Negative.   Genitourinary: Negative.   Musculoskeletal: Negative.   Skin: Negative.   Neurological: Negative.   Endo/Heme/Allergies: Negative.   Psychiatric/Behavioral: Negative.    Physical Exam: Estimated body mass index is 29.34 kg/(m^2) as calculated from the following:   Height as of this encounter: 5' 1.5" (1.562 m).   Weight as of this encounter: 157 lb 12.8 oz (71.578 kg). BP 122/70 mmHg  Pulse 75  Temp(Src) 98.1 F (36.7 C) (Temporal)  Resp 16  Ht 5' 1.5" (1.562 m)  Wt 157 lb 12.8 oz (71.578 kg)  BMI 29.34 kg/m2  SpO2 96%  LMP 05/30/2013 General Appearance: Well nourished, in no apparent distress. Eyes:  PERRLA, EOMs, conjunctiva no swelling or erythema, normal fundi and vessels. Sinuses: No Frontal/maxillary tenderness ENT/Mouth: Ext aud canals clear, normal light reflex with TMs without erythema, bulging.  Good dentition. No erythema, swelling, or exudate on post pharynx. Tonsils not swollen or erythematous. Hearing normal.  Neck: Supple, thyroid normal. No bruits Respiratory: Respiratory effort normal, BS equal bilaterally without rales, rhonchi, wheezing or stridor. Cardio: RRR without murmurs, rubs or gallops. Brisk peripheral pulses without edema.  Chest: symmetric, with normal excursions and percussion. Breasts: Symmetric, + FBD, without nipple discharge, retractions. Abdomen: Soft, +BS. Non tender, no guarding, rebound, hernias, masses, or organomegaly. .  Lymphatics: Non tender without  lymphadenopathy.  Genitourinary: defer Musculoskeletal: Full ROM all peripheral extremities,5/5 strength, and normal gait. Skin: Right supraclavicular lipoma 76mm x 67mm. Warm, dry without rashes, lesions, ecchymosis.  Neuro: Cranial nerves intact, reflexes equal bilaterally. Normal muscle tone, no cerebellar symptoms. Sensation intact.  Psych: Awake and oriented X 3, normal affect, Insight and Judgment appropriate.   EKG: WNL no changes. AORTA SCAN:  defer   Vicie Mutters 9:07 AM

## 2016-05-14 NOTE — Patient Instructions (Addendum)
Get mammogram at your work  Get chest [place]!!  Call about the cologuard Cologuard is an easy to use noninvasive colon cancer screening test based on the latest advances in stool DNA science.   Colon cancer is 3rd most diagnosed cancer and 2nd leading cause of death in both men and women 55 years of age and older despite being one of the most preventable and treatable cancers if found early.  4 of out 5 people diagnosed with colon cancer have NO prior family history.  When caught EARLY 90% of colon cancer is curable.   You have agreed to do a Cologuard screening and have declined a colonoscopy in spite of being explained the risks and benefits of the colonoscopy in detail, including cancer and death. Please understand that this is test not as sensitive or specific as a colonoscopy and you are still recommended to get a colonoscopy.   If you are NOT medicare please call your insurance company and given them this CPT code, [phone], in order to see how much your insurance company will cover Or you can call [phone] to talk with Cologuard about pricing and coverage.  Out-of-pocket cost for Cologuard can range from $0 - $649 so please call  You will receive a short call from [place] support center at [employer], when you receive a call they will say they are from [place],  to confirm your mailing address and give you more information.  When they calll you, it will appear on the caller ID as "[employer]" or in some cases only this number will appear, [phone].   [employer] will ship your collection kit directly to you. You will collect a single stool sample in the privacy of your own home, no special preparation required. You will return the kit via [place] pre-paid shipping or pick-up, in the same box it arrived in. Then I will contact you to discuss your results after I receive them from the laboratory.   If you have any questions or  concerns, [employer] Customer Support Specialist are available 24 hours a day, 7 days a week at [phone] or go to [email or web address].    Preventive Care for Adults  A healthy lifestyle and preventive care can promote health and wellness. Preventive health guidelines for women include the following key practices.  A routine yearly physical is a good way to check with your health care provider about your health and preventive screening. It is a chance to share any concerns and updates on your health and to receive a thorough exam.  Visit your dentist for a routine exam and preventive care every 6 months. Brush your teeth twice a day and floss once a day. Good oral hygiene prevents tooth decay and gum disease.  The frequency of eye exams is based on your age, health, family medical history, use of contact lenses, and other factors. Follow your health care provider's recommendations for frequency of eye exams.  Eat a healthy diet. Foods like vegetables, fruits, whole grains, low-fat dairy products, and lean protein foods contain the nutrients you need without too many calories. Decrease your intake of foods high in solid fats, added sugars, and salt. Eat the right amount of calories for you.Get information about a proper diet from your health care provider, if necessary.  Regular physical exercise is one of the most important things you can do for your health. Most adults should get at least 150 minutes of moderate-intensity exercise (any activity that  increases your heart rate and causes you to sweat) each week. In addition, most adults need muscle-strengthening exercises on 2 or more days a week.  Maintain a healthy weight. The body mass index (BMI) is a screening tool to identify possible weight problems. It provides an estimate of body fat based on height and weight. Your health care provider can find your BMI and can help you achieve or maintain a healthy weight.For adults 20 years and  older:  A BMI below 18.5 is considered underweight.  A BMI of 18.5 to 24.9 is normal.  A BMI of 25 to 29.9 is considered overweight.  A BMI of 30 and above is considered obese.  Maintain normal blood lipids and cholesterol levels by exercising and minimizing your intake of saturated fat. Eat a balanced diet with plenty of fruit and vegetables. Blood tests for lipids and cholesterol should begin at age 59 and be repeated every 5 years. If your lipid or cholesterol levels are high, you are over 50, or you are at high risk for heart disease, you may need your cholesterol levels checked more frequently.Ongoing high lipid and cholesterol levels should be treated with medicines if diet and exercise are not working.  If you smoke, find out from your health care provider how to quit. If you do not use tobacco, do not start.  Lung cancer screening is recommended for adults aged 90-80 years who are at high risk for developing lung cancer because of a history of smoking. A yearly low-dose CT scan of the lungs is recommended for people who have at least a 30-pack-year history of smoking and are a current smoker or have quit within the past 15 years. A pack year of smoking is smoking an average of 1 pack of cigarettes a day for 1 year (for example: 1 pack a day for 30 years or 2 packs a day for 15 years). Yearly screening should continue until the smoker has stopped smoking for at least 15 years. Yearly screening should be stopped for people who develop a health problem that would prevent them from having lung cancer treatment.  High blood pressure causes heart disease and increases the risk of stroke. Your blood pressure should be checked at least every 1 to 2 years. Ongoing high blood pressure should be treated with medicines if weight loss and exercise do not work.  If you are 69-79 years old, ask your health care provider if you should take aspirin to prevent strokes.  Diabetes screening involves taking  a blood sample to check your fasting blood sugar level. This should be done once every 3 years, after age 78, if you are within normal weight and without risk factors for diabetes. Testing should be considered at a younger age or be carried out more frequently if you are overweight and have at least 1 risk factor for diabetes.  Breast cancer screening is essential preventive care for women. You should practice "breast self-awareness." This means understanding the normal appearance and feel of your breasts and may include breast self-examination. Any changes detected, no matter how small, should be reported to a health care provider. Women in their 63s and 30s should have a clinical breast exam (CBE) by a health care provider as part of a regular health exam every 1 to 3 years. After age 76, women should have a CBE every year. Starting at age 68, women should consider having a mammogram (breast X-ray test) every year. Women who have a family history of  breast cancer should talk to their health care provider about genetic screening. Women at a high risk of breast cancer should talk to their health care providers about having an MRI and a mammogram every year.  Breast cancer gene (BRCA)-related cancer risk assessment is recommended for women who have family members with BRCA-related cancers. BRCA-related cancers include breast, ovarian, tubal, and peritoneal cancers. Having family members with these cancers may be associated with an increased risk for harmful changes (mutations) in the breast cancer genes BRCA1 and BRCA2. Results of the assessment will determine the need for genetic counseling and BRCA1 and BRCA2 testing.  Routine pelvic exams to screen for cancer are no longer recommended for nonpregnant women who are considered low risk for cancer of the pelvic organs (ovaries, uterus, and vagina) and who do not have symptoms. Ask your health care provider if a screening pelvic exam is right for you.  If you  have had past treatment for cervical cancer or a condition that could lead to cancer, you need Pap tests and screening for cancer for at least 20 years after your treatment. If Pap tests have been discontinued, your risk factors (such as having a new sexual partner) need to be reassessed to determine if screening should be resumed. Some women have medical problems that increase the chance of getting cervical cancer. In these cases, your health care provider may recommend more frequent screening and Pap tests.  Colorectal cancer can be detected and often prevented. Most routine colorectal cancer screening begins at the age of 18 years and continues through age 52 years. However, your health care provider may recommend screening at an earlier age if you have risk factors for colon cancer. On a yearly basis, your health care provider may provide home test kits to check for hidden blood in the stool. Use of a small camera at the end of a tube, to directly examine the colon (sigmoidoscopy or colonoscopy), can detect the earliest forms of colorectal cancer. Talk to your health care provider about this at age 18, when routine screening begins. Direct exam of the colon should be repeated every 5-10 years through age 39 years, unless early forms of pre-cancerous polyps or small growths are found.  Hepatitis C blood testing is recommended for all people born from [birth year] through 1965 and any individual with known risks for hepatitis C.  Pra  Osteoporosis is a disease in which the bones lose minerals and strength with aging. This can result in serious bone fractures or breaks. The risk of osteoporosis can be identified using a bone density scan. Women ages 85 years and over and women at risk for fractures or osteoporosis should discuss screening with their health care providers. Ask your health care provider whether you should take a calcium supplement or vitamin D to reduce the rate of osteoporosis.  Menopause can be  associated with physical symptoms and risks. Hormone replacement therapy is available to decrease symptoms and risks. You should talk to your health care provider about whether hormone replacement therapy is right for you.  Use sunscreen. Apply sunscreen liberally and repeatedly throughout the day. You should seek shade when your shadow is shorter than you. Protect yourself by wearing long sleeves, pants, a wide-brimmed hat, and sunglasses year round, whenever you are outdoors.  Once a month, do a whole body skin exam, using a mirror to look at the skin on your back. Tell your health care provider of new moles, moles that have irregular borders, moles that  are larger than a pencil eraser, or moles that have changed in shape or color.  Stay current with required vaccines (immunizations).  Influenza vaccine. All adults should be immunized every year.  Tetanus, diphtheria, and acellular pertussis (Td, Tdap) vaccine. Pregnant women should receive 1 dose of Tdap vaccine during each pregnancy. The dose should be obtained regardless of the length of time since the last dose. Immunization is preferred during the 27th-36th week of gestation. An adult who has not previously received Tdap or who does not know her vaccine status should receive 1 dose of Tdap. This initial dose should be followed by tetanus and diphtheria toxoids (Td) booster doses every 10 years. Adults with an unknown or incomplete history of completing a 3-dose immunization series with Td-containing vaccines should begin or complete a primary immunization series including a Tdap dose. Adults should receive a Td booster every 10 years.  Varicella vaccine. An adult without evidence of immunity to varicella should receive 2 doses or a second dose if she has previously received 1 dose. Pregnant females who do not have evidence of immunity should receive the first dose after pregnancy. This first dose should be obtained before leaving the health care  facility. The second dose should be obtained 4-8 weeks after the first dose.  Human papillomavirus (HPV) vaccine. Females aged 13-26 years who have not received the vaccine previously should obtain the 3-dose series. The vaccine is not recommended for use in pregnant females. However, pregnancy testing is not needed before receiving a dose. If a female is found to be pregnant after receiving a dose, no treatment is needed. In that case, the remaining doses should be delayed until after the pregnancy. Immunization is recommended for any person with an immunocompromised condition through the age of 33 years if she did not get any or all doses earlier. During the 3-dose series, the second dose should be obtained 4-8 weeks after the first dose. The third dose should be obtained 24 weeks after the first dose and 16 weeks after the second dose.  Zoster vaccine. One dose is recommended for adults aged 93 years or older unless certain conditions are present.  Measles, mumps, and rubella (MMR) vaccine. Adults born before [birth year] generally are considered immune to measles and mumps. Adults born in [birth year] or later should have 1 or more doses of MMR vaccine unless there is a contraindication to the vaccine or there is laboratory evidence of immunity to each of the three diseases. A routine second dose of MMR vaccine should be obtained at least 28 days after the first dose for students attending postsecondary schools, health care workers, or international travelers. People who received inactivated measles vaccine or an unknown type of measles vaccine during 1963-1967 should receive 2 doses of MMR vaccine. People who received inactivated mumps vaccine or an unknown type of mumps vaccine before 1979 and are at high risk for mumps infection should consider immunization with 2 doses of MMR vaccine. For females of childbearing age, rubella immunity should be determined. If there is no evidence of immunity, females who are not  pregnant should be vaccinated. If there is no evidence of immunity, females who are pregnant should delay immunization until after pregnancy. Unvaccinated health care workers born before [birth year] who lack laboratory evidence of measles, mumps, or rubella immunity or laboratory confirmation of disease should consider measles and mumps immunization with 2 doses of MMR vaccine or rubella immunization with 1 dose of MMR vaccine.  Pneumococcal 13-valent conjugate (PCV13) vaccine.  When indicated, a person who is uncertain of her immunization history and has no record of immunization should receive the PCV13 vaccine. An adult aged 92 years or older who has certain medical conditions and has not been previously immunized should receive 1 dose of PCV13 vaccine. This PCV13 should be followed with a dose of pneumococcal polysaccharide (PPSV23) vaccine. The PPSV23 vaccine dose should be obtained at least 8 weeks after the dose of PCV13 vaccine. An adult aged 4 years or older who has certain medical conditions and previously received 1 or more doses of PPSV23 vaccine should receive 1 dose of PCV13. The PCV13 vaccine dose should be obtained 1 or more years after the last PPSV23 vaccine dose.    Pneumococcal polysaccharide (PPSV23) vaccine. When PCV13 is also indicated, PCV13 should be obtained first. All adults aged 28 years and older should be immunized. An adult younger than age 34 years who has certain medical conditions should be immunized. Any person who resides in a nursing home or long-term care facility should be immunized. An adult smoker should be immunized. People with an immunocompromised condition and certain other conditions should receive both PCV13 and PPSV23 vaccines. People with human immunodeficiency virus (HIV) infection should be immunized as soon as possible after diagnosis. Immunization during chemotherapy or radiation therapy should be avoided. Routine use of PPSV23 vaccine is not recommended for  American Indians, [place] Natives, or people younger than 65 years unless there are medical conditions that require PPSV23 vaccine. When indicated, people who have unknown immunization and have no record of immunization should receive PPSV23 vaccine. One-time revaccination 5 years after the first dose of PPSV23 is recommended for people aged 19-64 years who have chronic kidney failure, nephrotic syndrome, asplenia, or immunocompromised conditions. People who received 1-2 doses of PPSV23 before age 30 years should receive another dose of PPSV23 vaccine at age 14 years or later if at least 5 years have passed since the previous dose. Doses of PPSV23 are not needed for people immunized with PPSV23 at or after age 72 years.  Preventive Services / Frequency   Ages 37 to 39 years  Blood pressure check.  Lipid and cholesterol check.  Lung cancer screening. / Every year if you are aged 30-80 years and have a 30-pack-year history of smoking and currently smoke or have quit within the past 15 years. Yearly screening is stopped once you have quit smoking for at least 15 years or develop a health problem that would prevent you from having lung cancer treatment.  Clinical breast exam.** / Every year after age 8 years.  BRCA-related cancer risk assessment.** / For women who have family members with a BRCA-related cancer (breast, ovarian, tubal, or peritoneal cancers).  Mammogram.** / Every year beginning at age 93 years and continuing for as long as you are in good health. Consult with your health care provider.  Pap test.** / Every 3 years starting at age 42 years through age 38 or 29 years with a history of 3 consecutive normal Pap tests.  HPV screening.** / Every 3 years from ages 72 years through ages 27 to 42 years with a history of 3 consecutive normal Pap tests.  Fecal occult blood test (FOBT) of stool. / Every year beginning at age 80 years and continuing until age 45 years. You may not need to do  this test if you get a colonoscopy every 10 years.  Flexible sigmoidoscopy or colonoscopy.** / Every 5 years for a flexible sigmoidoscopy or every 10  years for a colonoscopy beginning at age 50 years and continuing until age 61 years.  Hepatitis C blood test.** / For all people born from [birth year] through 1965 and any individual with known risks for hepatitis C.  Skin self-exam. / Monthly.  Influenza vaccine. / Every year.  Tetanus, diphtheria, and acellular pertussis (Tdap/Td) vaccine.** / Consult your health care provider. Pregnant women should receive 1 dose of Tdap vaccine during each pregnancy. 1 dose of Td every 10 years.  Varicella vaccine.** / Consult your health care provider. Pregnant females who do not have evidence of immunity should receive the first dose after pregnancy.  Zoster vaccine.** / 1 dose for adults aged 8 years or older.  Pneumococcal 13-valent conjugate (PCV13) vaccine.** / Consult your health care provider.  Pneumococcal polysaccharide (PPSV23) vaccine.** / 1 to 2 doses if you smoke cigarettes or if you have certain conditions.  Meningococcal vaccine.** / Consult your health care provider.  Hepatitis A vaccine.** / Consult your health care provider.  Hepatitis B vaccine.** / Consult your health care provider. Screening for abdominal aortic aneurysm (AAA)  by ultrasound is recommended for people over 50 who have history of high blood pressure or who are current or former smokers.

## 2016-05-15 LAB — LIPID PANEL
CHOLESTEROL: 189 mg/dL (ref 125–200)
HDL: 71 mg/dL (ref >=46)
LDL Cholesterol: 96 mg/dL (ref <130)
TRIGLYCERIDES: 112 mg/dL (ref <150)
Total CHOL/HDL Ratio: 2.7 Ratio (ref <=5.0)
VLDL: 22 mg/dL (ref <30)

## 2016-05-15 LAB — URINALYSIS, ROUTINE W REFLEX MICROSCOPIC
Bilirubin Urine: NEGATIVE
Glucose, UA: NEGATIVE
Hgb urine dipstick: NEGATIVE
Ketones, ur: NEGATIVE
Leukocytes, UA: NEGATIVE
NITRITE: NEGATIVE
Protein, ur: NEGATIVE
SPECIFIC GRAVITY, URINE: 1.022 (ref 1.001–1.035)
pH: 6.5 (ref 5.0–8.0)

## 2016-05-15 LAB — HEPATIC FUNCTION PANEL
ALT: 13 U/L (ref 6–29)
AST: 19 U/L (ref 10–35)
Albumin: 4.2 g/dL (ref 3.6–5.1)
Alkaline Phosphatase: 72 U/L (ref 33–130)
BILIRUBIN INDIRECT: 0.4 mg/dL (ref 0.2–1.2)
BILIRUBIN TOTAL: 0.5 mg/dL (ref 0.2–1.2)
Bilirubin, Direct: 0.1 mg/dL (ref <=0.2)
TOTAL PROTEIN: 7 g/dL (ref 6.1–8.1)

## 2016-05-15 LAB — BASIC METABOLIC PANEL WITH GFR
BUN: 14 mg/dL (ref 7–25)
CALCIUM: 9.4 mg/dL (ref 8.6–10.4)
CO2: 26 mmol/L (ref 20–31)
Chloride: 101 mmol/L (ref 98–110)
Creat: 0.76 mg/dL (ref 0.50–1.05)
GFR, EST NON AFRICAN AMERICAN: 89 mL/min (ref >=60)
GLUCOSE: 95 mg/dL (ref 65–99)
Potassium: 4.3 mmol/L (ref 3.5–5.3)
SODIUM: 137 mmol/L (ref 135–146)

## 2016-05-15 LAB — MICROALBUMIN / CREATININE URINE RATIO
CREATININE, URINE: 249 mg/dL (ref 20–320)
MICROALB UR: 0.9 mg/dL
Microalb Creat Ratio: 4 mcg/mg creat (ref <30)

## 2016-05-15 LAB — VITAMIN D 25 HYDROXY (VIT D DEFICIENCY, FRACTURES): Vit D, 25-Hydroxy: 38 ng/mL (ref 30–100)

## 2016-05-15 LAB — MAGNESIUM: Magnesium: 1.9 mg/dL (ref 1.5–2.5)

## 2016-05-16 ENCOUNTER — Other Ambulatory Visit: Payer: Self-pay | Admitting: Physician Assistant

## 2016-05-16 MED ORDER — ALPRAZOLAM 0.5 MG PO TABS: 0.5000 mg | ORAL_TABLET | Freq: Three times a day (TID) | ORAL | Status: DC | PRN

## 2016-07-11 IMAGING — US US PELVIS COMPLETE
1 series · 14 of 25 positions shown · non-contrast
Comparison: None

CLINICAL DATA: Lower abdominal pain. Postmenopausal vaginal
bleeding.

EXAM:
TRANSABDOMINAL AND TRANSVAGINAL ULTRASOUND OF PELVIS
TECHNIQUE: Both transabdominal and transvaginal ultrasound examinations of the
pelvis were performed. Transabdominal technique was performed for
global imaging of the pelvis including uterus, ovaries, adnexal
regions, and pelvic cul-de-sac. It was necessary to proceed with
endovaginal exam following the transabdominal exam to visualize the
endometrium and to attempt to visualize the left ovary not seen
transabdominally.

[Series 1: us pelvis complete · 0.24mm/px · 14 of 45 slices shown]
[im 1/45]
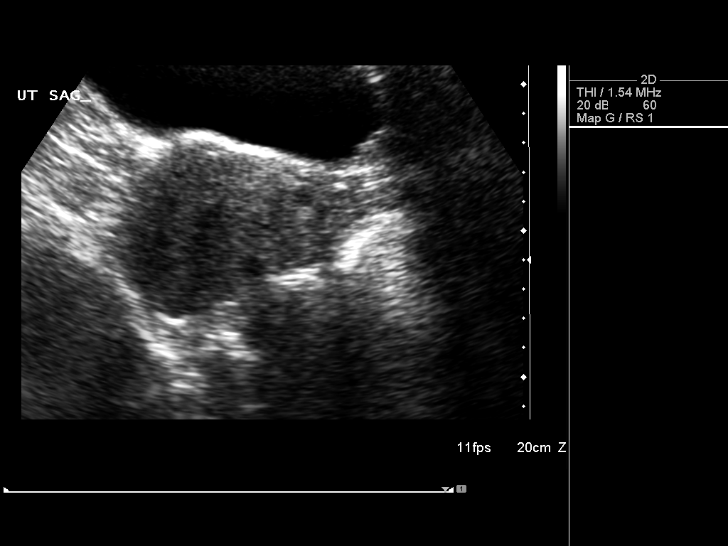
[im 4/45]
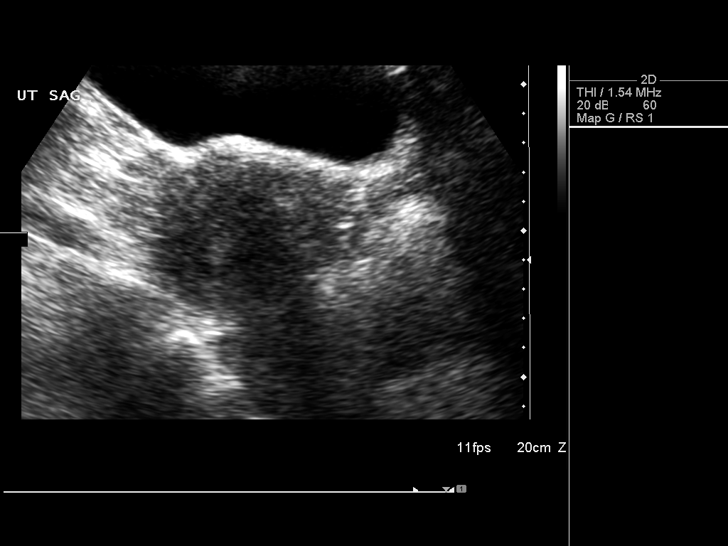
[im 8/45]
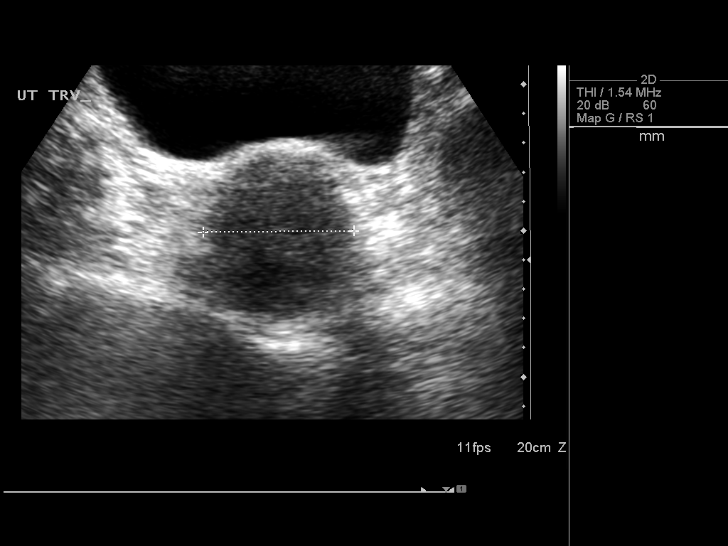
[im 12/45]
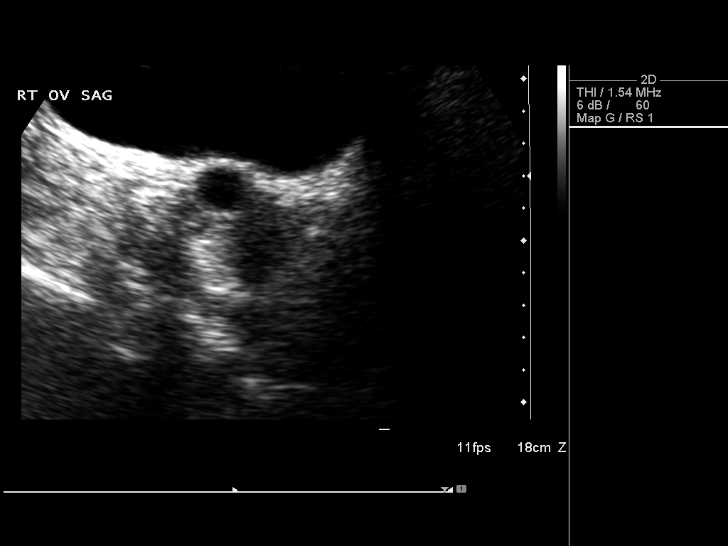
[im 15/45]
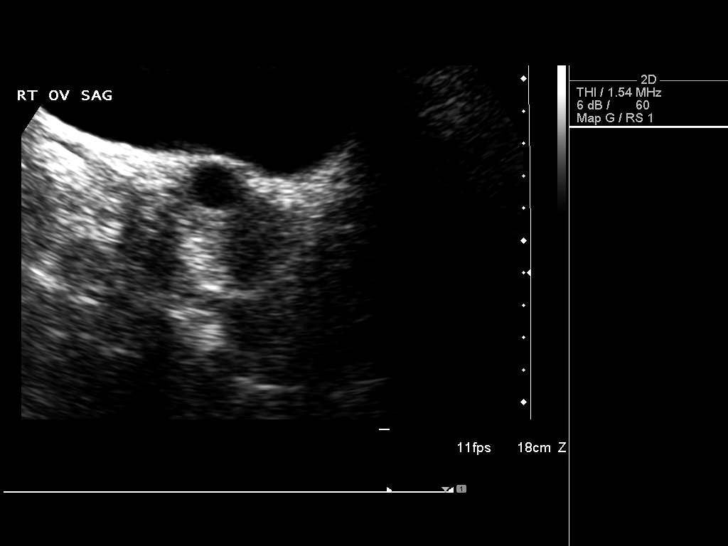
[im 17/45]
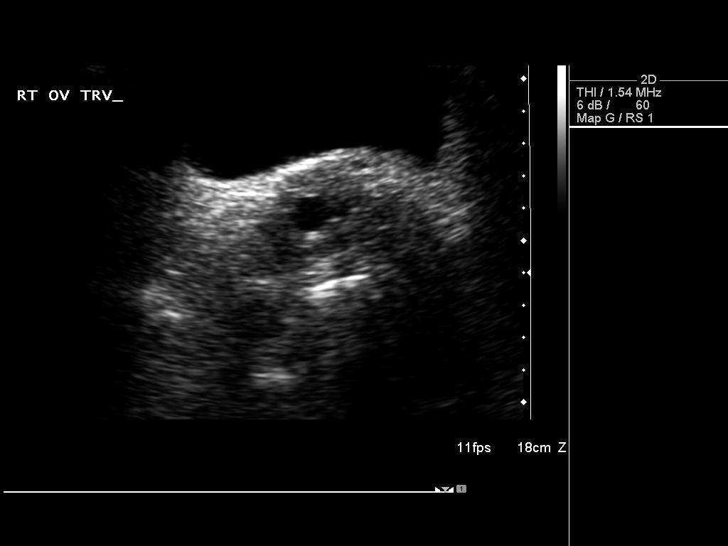
[im 21/45]
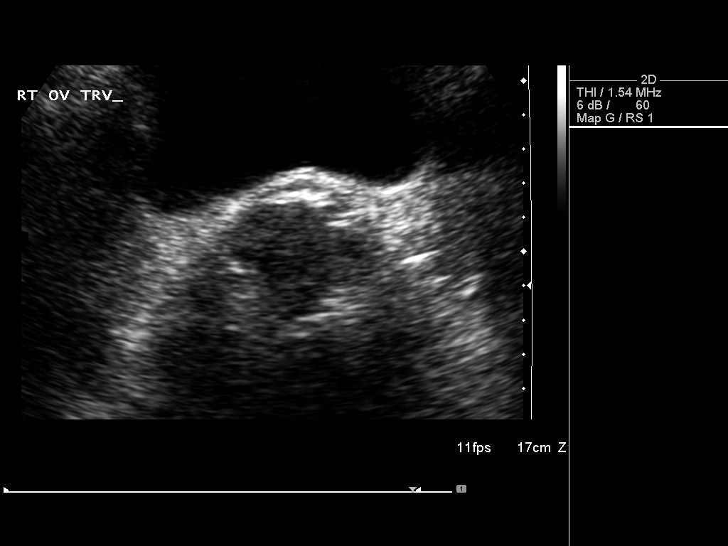
[im 24/45]
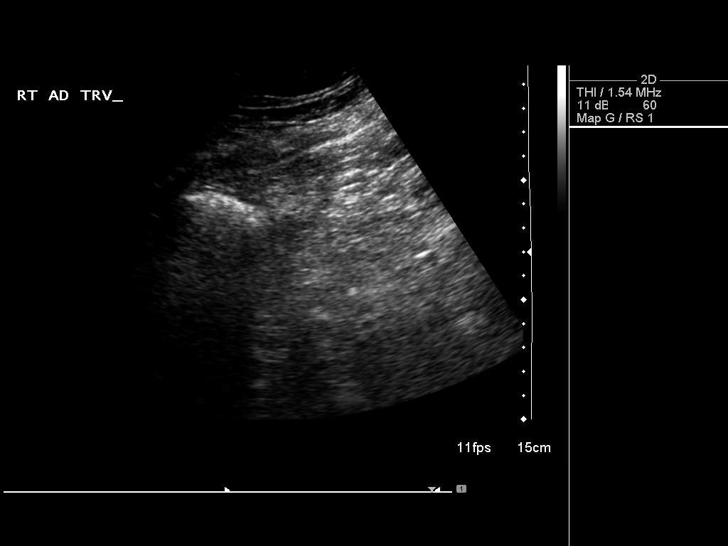
[im 28/45]
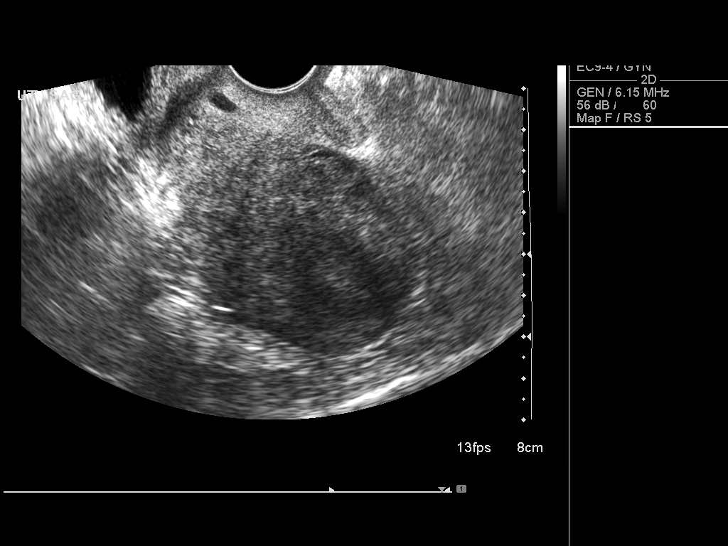
[im 30/45]
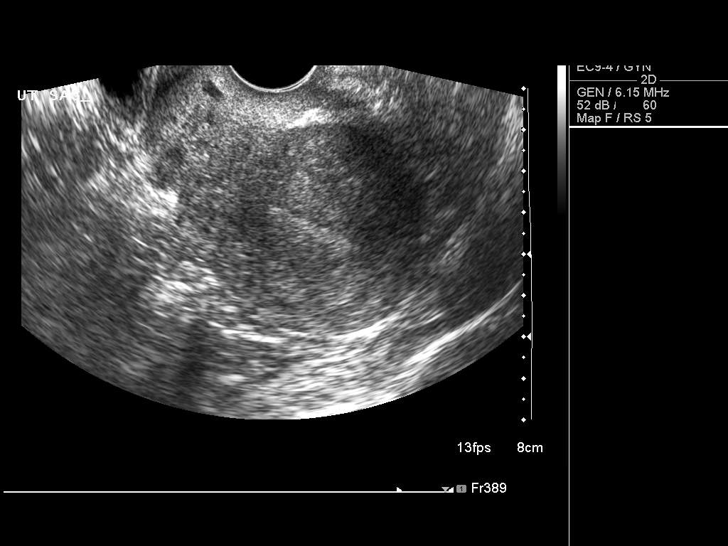
[im 34/45]
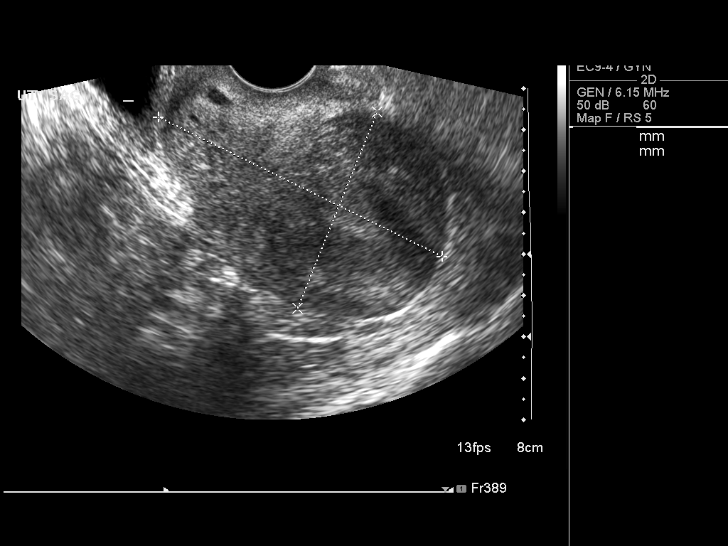
[im 37/45]
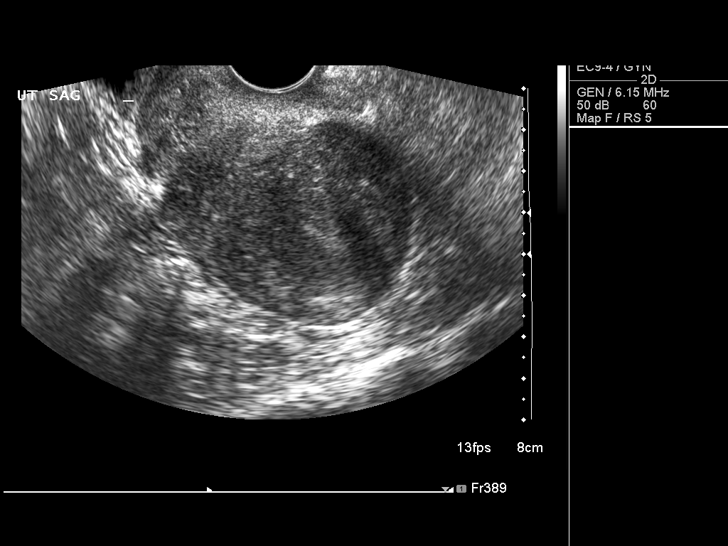
[im 41/45]
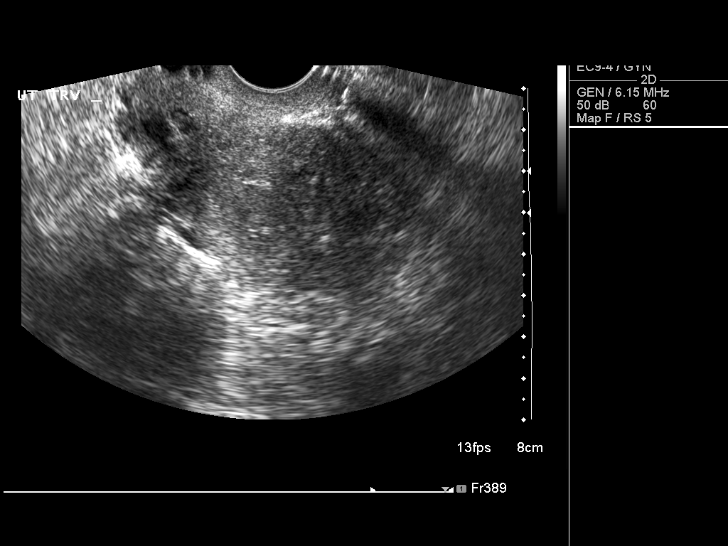
[im 45/45]
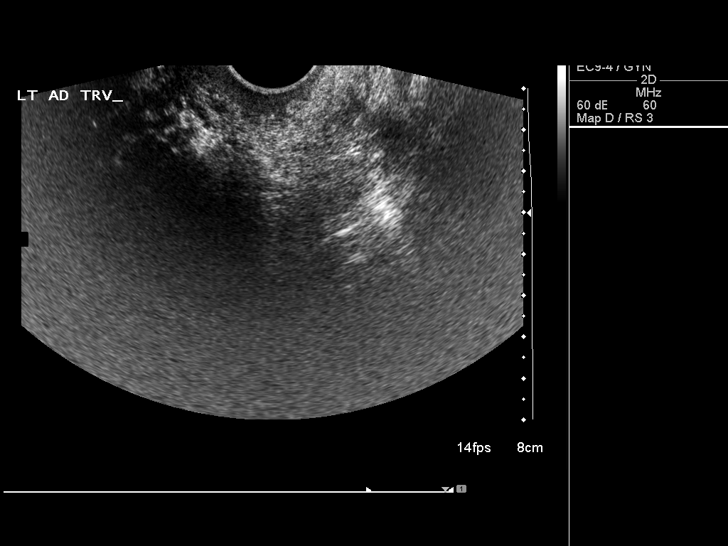

[14 of 25 positions shown; findings below may reference images not displayed]

FINDINGS: Uterus

Measurements: 7.6 x 5.4 x 5.1 cm. No fibroids or other mass
visualized.

Endometrium

Thickness: 3.5 mm.  No focal abnormality visualized.

Right ovary

Measurements: 1.6 x 1.5 x 1.4 cm. 1.5 cm simple appearing cyst.

Left ovary

Not visualized.

Other findings

No free fluid.
IMPRESSION: 1. Abnormal appearing endometrium and uterus.
2. Nonvisualized left ovary.

## 2016-07-21 ENCOUNTER — Ambulatory Visit (INDEPENDENT_AMBULATORY_CARE_PROVIDER_SITE_OTHER): Payer: 59 | Admitting: Physician Assistant

## 2016-07-21 ENCOUNTER — Encounter: Payer: Self-pay | Admitting: Physician Assistant

## 2016-07-21 VITALS — BP 128/72 | HR 93 | Temp 97.7°F | Resp 16 | Ht 61.5 in | Wt 162.0 lb

## 2016-07-21 DIAGNOSIS — R05 Cough: Secondary | ICD-10-CM | POA: Diagnosis not present

## 2016-07-21 DIAGNOSIS — R059 Cough, unspecified: Secondary | ICD-10-CM

## 2016-07-21 MED ORDER — FLUTICASONE FUROATE-VILANTEROL 100-25 MCG/INH IN AEPB: 1.0000 | INHALATION_SPRAY | Freq: Every day | RESPIRATORY_TRACT | 0 refills | Status: DC

## 2016-07-21 MED ORDER — ALBUTEROL SULFATE HFA 108 (90 BASE) MCG/ACT IN AERS: 2.0000 | INHALATION_SPRAY | Freq: Four times a day (QID) | RESPIRATORY_TRACT | 2 refills | Status: DC | PRN

## 2016-07-21 MED ORDER — DOXYCYCLINE HYCLATE 100 MG PO CAPS: ORAL_CAPSULE | ORAL | 0 refills | Status: DC

## 2016-07-21 NOTE — Patient Instructions (Signed)
The majority of colds are caused by viruses and do not require antibiotics. Please read the rest of this hand out to learn more about the common cold and what you can do to help yourself as well as help prevent the over use of antibiotics.   COMMON COLD SIGNS AND SYMPTOMS - The common cold usually causes nasal congestion, runny nose, and sneezing. A sore throat may be present on the first day but usually resolves quickly. If a cough occurs, it generally develops on about the fourth or fifth day of symptoms, and is always the last thing to go away.   COMMON COLD COMPLICATIONS - In most cases, colds do not cause serious illness or complications. Most colds last for three to seven days, although many people continue to have symptoms (coughing, sneezing, congestion) for up to two weeks.  COMMON COLD TREATMENT - There is no specific treatment for the viruses that cause the common cold. Most treatments are aimed at relieving some of the symptoms of the cold, but do not shorten or cure the cold.   Antibiotics are not useful for treating the common cold; antibiotics are only used to treat illnesses caused by bacteria, not viruses. Unnecessary use of antibiotics for the treatment of the common cold can cause allergic reactions, diarrhea, or other gastrointestinal symptoms in some patients.   The symptoms of a cold will resolve over time, even without any treatment. People with underlying medical conditions and those who use other over-the-counter or prescription medications should speak with their healthcare provider or pharmacist to ensure that it is safe to use these treatments. The following are treatments that may reduce the symptoms caused by the common cold.  Nasal congestion - Decongestants are good for nasal congestion- if you feel very stuffy but no mucus is coming out, this is the medication that will help you the most.  Pseudoephedrine is a decongestant that can improve nasal congestion. Although a  prescription is not required, drugstores in the [place] keep pseudoephedrine behind the counter, so it must be requested from a pharmacist. If you have a heart condition or high blood pressure please use Coricidin BPH instead.   Runny nose - Antihistamines such as diphenhydramine (Benadryl), certazine (Zyrtec) which are best taking at night because they can make you tired OR loratadine (Claritin),  fexafinadine (Allegra) help with a runny nose.   Nasal sprays such an oxymetazoline (Afrin and others) may also give temporary relief of nasal congestion. However, these sprays should never be used for more than two to three days; use for more than three days use can worsen congestion.  Nasocort is now over the counter and can help decrease a runny nose. Please stop the medication if you have blurry vision or nose bleeds.   Sore throat and headache - Sore throat and headache are best treated with a mild pain reliever such as acetaminophen (Tylenol) or a non-steroidal anti-inflammatory agent such as ibuprofen or naproxen (Motrin or Aleve). These medications should be taken with food to prevent stomach problems. As well as gargling with warm water and salt.   Cough - Common cough medicine ingredients include guaifenesin and dextromethorphan; these are often combined with other medications in over-the-counter cold formulas. Often a cough is worse at night or first in the morning due to post nasal drip from you nose. You can try to sleep at an angle to decrease a cough.   Alternative treatments - Heated, humidified air can improve symptoms of nasal congestion and   runny nose, and causes few to no side effects. A number of alternative products, including vitamin C, doubling up on your vitamin D and herbal products such as echinacea, may help. Certain products, such as nasal gels that contain zinc (eg, Zicam), have been associated with a permanent loss of smell.  Antibiotics - Antibiotics should not be used to  treat an uncomplicated common cold. Often you need to give your body 7-10 days to fight off a common cold while treating the symptoms with the medications listed above. If after 7-10 days your symptoms are not improving, you are getting worse, you have shortness of breath, chest pain, a fever of over 103 you should seek medical help immediately.   PREVENTION IS THE BEST MEDICINE - Hand washing is an essential and highly effective way to prevent the spread of infection.  Alcohol-based hand rubs are a good alternative for disinfecting hands if a sink is not available.  Hands should be washed before preparing food and eating and after coughing, blowing the nose, or sneezing. While it is not always possible to limit contact with people who may be infected with a cold, touching the eyes, nose, or mouth after direct contact should be avoided when possible. Sneezing/coughing into the sleeve of one's clothing (at the inner elbow) is another means of containing sprays of saliva and secretions and does not contaminate the hands.     

## 2016-07-21 NOTE — Progress Notes (Signed)
Subjective:    Patient ID: Sylvia Mclean, female    DOB: 01-15-1961, 55 y.o.   MRN: [id number]  HPI 55 y.o. WF with history of tobacco use presents with cold symptoms x 5 days. She has been using zyrtec, cough med, flonase, advil, albuterol. She has had sinus congestion that is improving, but still having nonproductive cough with some wheezing/shortness of breath, body aches, no fever or chills.   Blood pressure 128/72, pulse 93, temperature 97.7 F (36.5 C), resp. rate 16, height 5' 1.5" (1.562 m), weight 162 lb (73.5 kg), last menstrual period 05/30/2013, SpO2 97 %.  Medications Current Outpatient Prescriptions on File Prior to Visit  Medication Sig  . acyclovir (ZOVIRAX) 400 MG tablet TAKE ONE TABLET 3 TIMES A DAY AS NEEDED.  Marland Kitchen albuterol (PROVENTIL HFA;VENTOLIN HFA) 108 (90 BASE) MCG/ACT inhaler Inhale 2 puffs into the lungs every 6 (six) hours as needed for wheezing or shortness of breath.  . ALPRAZolam (XANAX) 0.5 MG tablet Take 1 tablet (0.5 mg total) by mouth 3 (three) times daily as needed for anxiety.  . cetirizine (ZYRTEC) 10 MG tablet Take 10 mg by mouth daily.  . DULoxetine (CYMBALTA) 60 MG capsule Take 1 capsule (60 mg total) by mouth daily.  Marland Kitchen esomeprazole (NEXIUM) 40 MG capsule Take 1 capsule (40 mg total) by mouth daily.  Marland Kitchen ipratropium (ATROVENT) 0.03 % nasal spray Place 2 sprays into both nostrils every 12 (twelve) hours.  Marland Kitchen olmesartan-hydrochlorothiazide (BENICAR HCT) 20-12.5 MG tablet Take 1 tablet by mouth daily.  Marland Kitchen triamcinolone cream (KENALOG) 0.5 % Apply 1 application topically 2 (two) times daily.   No current facility-administered medications on file prior to visit.     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   Review of Systems  Constitutional: Negative for chills, fatigue and fever.  HENT: Positive for  congestion, rhinorrhea and sinus pressure. Negative for dental problem, ear discharge, ear pain, nosebleeds, sore throat, trouble swallowing and voice change.   Respiratory: Positive for cough, shortness of breath and wheezing. Negative for chest tightness.   Cardiovascular: Negative.   Gastrointestinal: Negative.   Genitourinary: Negative.   Musculoskeletal: Negative.   Neurological: Negative.        Objective:   Physical Exam  Constitutional: She is oriented to person, place, and time. She appears well-developed and well-nourished.  HENT:  Head: Normocephalic and atraumatic.  Right Ear: External ear normal.  Left Ear: External ear normal.  Nose: Nose normal.  Mouth/Throat: Oropharynx is clear and moist.  Eyes: Conjunctivae are normal. Pupils are equal, round, and reactive to light.  Neck: Normal range of motion. Neck supple.  Cardiovascular: Normal rate and regular rhythm.   Pulmonary/Chest: Effort normal. No respiratory distress. She has wheezes (worse right lower lobe). She has no rales. She exhibits no tenderness.  Abdominal: Soft. Bowel sounds are normal.  Lymphadenopathy:    She has no cervical adenopathy.  Neurological: She is alert and oriented to person, place, and time.  Skin: Skin is warm and dry.       Assessment & Plan:   Cough Advised to quit smoking -     DG Chest 2 View; Future -     albuterol (PROVENTIL HFA;VENTOLIN HFA) 108 (90 Base) MCG/ACT inhaler; Inhale 2 puffs into the lungs every 6 (six) hours as needed for wheezing or shortness of breath. -     doxycycline (  VIBRAMYCIN) 100 MG capsule; Take 1 capsule twice daily with food -     fluticasone furoate-vilanterol (BREO ELLIPTA) 100-25 MCG/INH AEPB; Inhale 1 puff into the lungs daily. Rinse mouth with water after each use

## 2016-07-22 ENCOUNTER — Ambulatory Visit (HOSPITAL_COMMUNITY)
Admission: RE | Admit: 2016-07-22 | Discharge: 2016-07-22 | Disposition: A | Discharge status: Home / Self Care | Payer: 59 | Source: Ambulatory Visit | Attending: Physician Assistant | Admitting: Physician Assistant

## 2016-07-22 DIAGNOSIS — R5383 Other fatigue: Secondary | ICD-10-CM | POA: Insufficient documentation

## 2016-07-22 DIAGNOSIS — F17210 Nicotine dependence, cigarettes, uncomplicated: Secondary | ICD-10-CM | POA: Diagnosis not present

## 2016-07-22 DIAGNOSIS — R05 Cough: Secondary | ICD-10-CM | POA: Diagnosis not present

## 2016-07-22 DIAGNOSIS — R059 Cough, unspecified: Secondary | ICD-10-CM

## 2016-08-21 ENCOUNTER — Other Ambulatory Visit: Payer: Self-pay | Admitting: Physician Assistant

## 2016-08-21 MED ORDER — ALPRAZOLAM 0.5 MG PO TABS: 0.5000 mg | ORAL_TABLET | Freq: Three times a day (TID) | ORAL | 0 refills | Status: DC | PRN

## 2016-10-10 ENCOUNTER — Other Ambulatory Visit: Payer: Self-pay | Admitting: Physician Assistant

## 2016-10-10 MED ORDER — ALPRAZOLAM 0.5 MG PO TABS: 0.5000 mg | ORAL_TABLET | Freq: Three times a day (TID) | ORAL | 0 refills | Status: DC | PRN

## 2016-10-13 NOTE — Progress Notes (Signed)
Xanax was called into [facility].

## 2016-11-14 ENCOUNTER — Ambulatory Visit: Payer: Self-pay | Admitting: Physician Assistant

## 2016-11-20 ENCOUNTER — Other Ambulatory Visit: Payer: Self-pay | Admitting: Physician Assistant

## 2016-11-20 MED ORDER — ALPRAZOLAM 0.5 MG PO TABS: 0.5000 mg | ORAL_TABLET | Freq: Three times a day (TID) | ORAL | 0 refills | Status: DC | PRN

## 2016-11-20 NOTE — Telephone Encounter (Signed)
Xanax called into pharmacy.

## 2016-12-01 ENCOUNTER — Other Ambulatory Visit: Payer: Self-pay

## 2016-12-01 DIAGNOSIS — F325 Major depressive disorder, single episode, in full remission: Secondary | ICD-10-CM

## 2016-12-01 MED ORDER — DULOXETINE HCL 60 MG PO CPEP: 60.0000 mg | ORAL_CAPSULE | Freq: Every day | ORAL | 5 refills | Status: DC

## 2016-12-12 ENCOUNTER — Ambulatory Visit: Payer: Self-pay | Admitting: Physician Assistant

## 2017-01-02 ENCOUNTER — Other Ambulatory Visit: Payer: Self-pay

## 2017-01-02 ENCOUNTER — Other Ambulatory Visit: Payer: Self-pay | Admitting: Physician Assistant

## 2017-01-02 DIAGNOSIS — I10 Essential (primary) hypertension: Secondary | ICD-10-CM

## 2017-01-02 MED ORDER — ALPRAZOLAM 0.5 MG PO TABS: 0.5000 mg | ORAL_TABLET | Freq: Three times a day (TID) | ORAL | 0 refills | Status: DC | PRN

## 2017-01-02 MED ORDER — OLMESARTAN MEDOXOMIL-HCTZ 20-12.5 MG PO TABS: 1.0000 | ORAL_TABLET | Freq: Every day | ORAL | 5 refills | Status: DC

## 2017-01-05 NOTE — Progress Notes (Signed)
Xanax was called into pharmacy @ 8:28am on 12th March 2018 by DD

## 2017-01-12 ENCOUNTER — Ambulatory Visit: Payer: Self-pay | Admitting: Physician Assistant

## 2017-01-12 NOTE — Progress Notes (Deleted)
Assessment and Plan:  Hypertension: Continue medication, monitor blood pressure at home. Continue DASH diet.  Reminder to go to the ER if any CP, SOB, nausea, dizziness, severe HA, changes vision/speech, left arm numbness and tingling, and jaw pain. Cholesterol: Continue diet and exercise. Check cholesterol.  Vitamin D Def- check level and continue medications.  Smoking cessation-  counseled patient on the dangers of tobacco use, advised patient to stop smoking, and reviewed strategies to maximize success, get CXR  Continue diet and meds as discussed. Future Appointments Date Time Provider [place]  01/12/2017 3:00 PM Vicie Mutters, PA-C [facility] None  05/18/2017 9:00 AM Vicie Mutters, PA-C [facility] None    HPI 56 y.o. female  presents for 3 month follow up with hypertension, hyperlipidemia, prediabetes and vitamin D. Her blood pressure has been controlled at home, today their BP is   She does workout but sporadic She denies chest pain, shortness of breath, dizziness.  She is not on cholesterol medication and denies myalgias. Her cholesterol is at goal. The cholesterol last visit was:   Lab Results  Component Value Date   CHOL 189 05/14/2016   HDL 71 05/14/2016   LDLCALC 96 05/14/2016   TRIG 112 05/14/2016   CHOLHDL 2.7 05/14/2016  She has been working on diet and exercise for prediabetes, and denies paresthesia of the feet, polydipsia, polyuria and visual disturbances. Last A1C in the office was:  Lab Results  Component Value Date   HGBA1C 5.5 05/14/2015  Patient is on Vitamin D supplement, taking it 3 days a week will start to take it 2 days a week.   Lab Results  Component Value Date   VD25OH 55 05/14/2016    She continues to smoke and is not interested in quitting. She is on cymbalta and xanax for depression and xaanx   Current Medications:  Current Outpatient Prescriptions on File Prior to Visit  Medication Sig Dispense Refill  . acyclovir (ZOVIRAX) 400 MG  tablet TAKE ONE TABLET 3 TIMES A DAY AS NEEDED. 30 tablet 2  . albuterol (PROVENTIL HFA;VENTOLIN HFA) 108 (90 Base) MCG/ACT inhaler Inhale 2 puffs into the lungs every 6 (six) hours as needed for wheezing or shortness of breath. 1 Inhaler 2  . ALPRAZolam (XANAX) 0.5 MG tablet Take 1 tablet (0.5 mg total) by mouth 3 (three) times daily as needed for anxiety. 90 tablet 0  . cetirizine (ZYRTEC) 10 MG tablet Take 10 mg by mouth daily.    Marland Kitchen doxycycline (VIBRAMYCIN) 100 MG capsule Take 1 capsule twice daily with food 20 capsule 0  . DULoxetine (CYMBALTA) 60 MG capsule Take 1 capsule (60 mg total) by mouth daily. 30 capsule 5  . esomeprazole (NEXIUM) 40 MG capsule Take 1 capsule (40 mg total) by mouth daily. 30 capsule 6  . fluticasone furoate-vilanterol (BREO ELLIPTA) 100-25 MCG/INH AEPB Inhale 1 puff into the lungs daily. Rinse mouth with water after each use 14 each 0  . ipratropium (ATROVENT) 0.03 % nasal spray Place 2 sprays into both nostrils every 12 (twelve) hours. 30 mL 12  . olmesartan-hydrochlorothiazide (BENICAR HCT) 20-12.5 MG tablet Take 1 tablet by mouth daily. 30 tablet 5  . triamcinolone cream (KENALOG) 0.5 % Apply 1 application topically 2 (two) times daily. 80 g 2   No current facility-administered medications on file prior to visit.    Medical History:  Past Medical History:  Diagnosis Date  . Allergy   . Anxiety   . Depression   . Hypertension 2006  Allergies:  Allergies  Allergen Reactions  . Azithromycin Nausea And Vomiting  . Prednisone     Post menopausal bleeding    Review of Systems:  Review of Systems  Constitutional: Negative.   HENT: Negative.   Eyes: Negative.   Respiratory: Negative.   Cardiovascular: Negative.   Gastrointestinal: Negative.   Genitourinary: Negative.   Musculoskeletal: Negative.   Skin: Negative.   Neurological: Negative.   Endo/Heme/Allergies: Negative.   Psychiatric/Behavioral: Negative.      Family history- Review and  unchanged Social history- Review and unchanged Physical Exam: LMP 05/30/2013  Wt Readings from Last 3 Encounters:  07/21/16 162 lb (73.5 kg)  05/14/16 157 lb 12.8 oz (71.6 kg)  11/16/15 158 lb (71.7 kg)   General Appearance: Well nourished, in no apparent distress. Eyes: PERRLA, EOMs, conjunctiva no swelling or erythema Sinuses: No Frontal/maxillary tenderness ENT/Mouth: Ext aud canals clear, TMs without erythema, bulging. No erythema, swelling, or exudate on post pharynx.  Tonsils not swollen or erythematous. Hearing normal.  Neck: Supple, thyroid normal.  Respiratory: Respiratory effort normal, BS equal bilaterally without rales, rhonchi, wheezing or stridor.  Cardio: RRR with no MRGs. Brisk peripheral pulses without edema.  Abdomen: Soft, + BS.  Non tender, no guarding, rebound, hernias, masses. Lymphatics: Non tender without lymphadenopathy.  Musculoskeletal: Full ROM, 5/5 strength, normal gait.  Skin: Warm, dry without rashes, lesions, ecchymosis.  Neuro: Cranial nerves intact. Normal muscle tone, no cerebellar symptoms. Sensation intact.  Psych: Awake and oriented X 3, normal affect, Insight and Judgment appropriate.    Vicie Mutters, PA-C 1:34 PM [facility]

## 2017-01-22 ENCOUNTER — Ambulatory Visit (INDEPENDENT_AMBULATORY_CARE_PROVIDER_SITE_OTHER): Payer: 59 | Admitting: Physician Assistant

## 2017-01-22 VITALS — BP 124/80 | HR 89 | Temp 97.7°F | Resp 16 | Ht 61.5 in | Wt 166.2 lb

## 2017-01-22 DIAGNOSIS — F17200 Nicotine dependence, unspecified, uncomplicated: Secondary | ICD-10-CM

## 2017-01-22 DIAGNOSIS — E785 Hyperlipidemia, unspecified: Secondary | ICD-10-CM

## 2017-01-22 DIAGNOSIS — I10 Essential (primary) hypertension: Secondary | ICD-10-CM | POA: Diagnosis not present

## 2017-01-22 DIAGNOSIS — F325 Major depressive disorder, single episode, in full remission: Secondary | ICD-10-CM

## 2017-01-22 NOTE — Patient Instructions (Signed)
Simple math prevails.    1st - exercise does not produce significant weight loss - at best one converts fat into muscle , "bulks up", loses inches, but usually stays "weight neutral"     2nd - think of your body weightas a check book: If you eat more calories than you burn up - you save money or gain weight .... Or if you spend more money than you put in the check book, ie burn up more calories than you eat, then you lose weight     3rd - if you walk or run 1 mile, you burn up 100 calories - you have to burn up 3,500 calories to lose 1 pound, ie you have to walk/run 35 miles to lose 1 measly pound. So if you want to lose 10 #, then you have to walk/run 350 miles, so.... clearly exercise is not the solution.     4. So if you consume 1,500 calories, then you have to burn up the equivalent of 15 miles to stay weight neutral - It also stands to reason that if you consume 1,500 cal/day and don't lose weight, then you must be burning up about 1,500 cals/day to stay weight neutral.     5. If you really want to lose weight, you must cut your calorie intake 300 calories /day and at that rate you should lose about 1 # every 3 days.   6. Please purchase Dr Fara Olden Fuhrman's book(s) "The End of Dieting" & "Eat to Live" . It has some great concepts and recipes.         Bad carbs also include fruit juice, alcohol, and sweet tea. These are empty calories that do not signal to your brain that you are full.   Please remember the good carbs are still carbs which convert into sugar. So please measure them out no more than 1/2-1 cup of rice, oatmeal, pasta, and beans  Veggies are however free foods! Pile them on.   Not all fruit is created equal. Please see the list below, the fruit at the bottom is higher in sugars than the fruit at the top. Please avoid all dried fruits.    If you have a smart phone, please look up Smoke Free app, this will help you stay on track and give you information about money you  have saved, life that you have gained back and a ton of more information.   We are giving you chantix for smoking cessation. You can do it! And we are here to help! You may have heard some scary side effects about chantix, the three most common I hear about are nausea, crazy dreams and depression.  However, I like for my patients to try to stay on 1/2 a tablet twice a day rather than one tablet twice a day as normally prescribed. This helps decrease the chances of side effects and helps save money by making a one month prescription last two months  Please start the prescription this way:  Start 1/2 tablet by mouth once daily after food with a full glass of water for 3 days Then do 1/2 tablet by mouth twice daily for 4 days. During this first week you can smoke, but try to stop after this week.  At this point we have several options: 1) continue on 1/2 tablet twice a day- which I encourage you to do. You can stay on this dose the rest of the time on the medication or if you still feel  the need to smoke you can do one of the two options below. 2) do one tablet in the morning and 1/2 in the evening which helps decrease dreams. 3) do one tablet twice a day.   What if I miss a dose? If you miss a dose, take it as soon as you can. If it is almost time for your next dose, take only that dose. Do not take double or extra doses.  What should I watch for while using this medicine? Visit your doctor or health care professional for regular check ups. Ask for ongoing advice and encouragement from your doctor or healthcare professional, friends, and family to help you quit. If you smoke while on this medication, quit again  Your mouth may get dry. Chewing sugarless gum or hard candy, and drinking plenty of water may help. Contact your doctor if the problem does not go away or is severe.  You may get drowsy or dizzy. Do not drive, use machinery, or do anything that needs mental alertness until you know how  this medicine affects you. Do not stand or sit up quickly, especially if you are an older patient.   The use of this medicine may increase the chance of suicidal thoughts or actions. Pay special attention to how you are responding while on this medicine. Any worsening of mood, or thoughts of suicide or dying should be reported to your health care professional right away.  ADVANTAGES OF QUITTING SMOKING  Within 20 minutes, blood pressure decreases. Your pulse is at normal level.  After 8 hours, carbon monoxide levels in the blood return to normal. Your oxygen level increases.  After 24 hours, the chance of having a heart attack starts to decrease. Your breath, hair, and body stop smelling like smoke.  After 48 hours, damaged nerve endings begin to recover. Your sense of taste and smell improve.  After 72 hours, the body is virtually free of nicotine. Your bronchial tubes relax and breathing becomes easier.  After 2 to 12 weeks, lungs can hold more air. Exercise becomes easier and circulation improves.  After 1 year, the risk of coronary heart disease is cut in half.  After 5 years, the risk of stroke falls to the same as a nonsmoker.  After 10 years, the risk of lung cancer is cut in half and the risk of other cancers decreases significantly.  After 15 years, the risk of coronary heart disease drops, usually to the level of a nonsmoker.  You will have extra money to spend on things other than cigarettes.

## 2017-01-22 NOTE — Progress Notes (Signed)
Assessment and Plan:  Hypertension: Continue medication, monitor blood pressure at home. Continue DASH diet.  Reminder to go to the ER if any CP, SOB, nausea, dizziness, severe HA, changes vision/speech, left arm numbness and tingling, and jaw pain. Cholesterol: Continue diet and exercise. Check cholesterol.  Vitamin D Def- check level and continue medications.  Smoking cessation-  counseled patient on the dangers of tobacco use, advised patient to stop smoking, and reviewed strategies to maximize success, get CXR  Continue diet and meds as discussed. Future Appointments Date Time Provider [place]  05/18/2017 9:00 AM Vicie Mutters, PA-C [facility] None    HPI 56 y.o. female  presents for 3 month follow up with hypertension, hyperlipidemia, prediabetes and vitamin D. Laid off from work Jan 31st.  Her blood pressure has been controlled at home, today their BP is BP: 124/80 She does workout but sporadic She denies chest pain, shortness of breath, dizziness.  She is not on cholesterol medication and denies myalgias. Her cholesterol is at goal. The cholesterol last visit was:   Lab Results  Component Value Date   CHOL 189 05/14/2016   HDL 71 05/14/2016   LDLCALC 96 05/14/2016   TRIG 112 05/14/2016   CHOLHDL 2.7 05/14/2016  She has been working on diet and exercise for prediabetes, and denies paresthesia of the feet, polydipsia, polyuria and visual disturbances. Last A1C in the office was:  Lab Results  Component Value Date   HGBA1C 5.5 05/14/2015  Patient is on Vitamin D supplement, taking it 3 days a week will start to take it 2 days a week.   Lab Results  Component Value Date   VD25OH 59 05/14/2016   She continues to smoke and is not interested in quitting. She is on cymbalta and xanax for depression, not needing xanax as much anymore since not working. Taking care of 31 year old grand daughter.   BMI is Body mass index is 30.89 kg/m., she is working on diet and  exercise. Wt Readings from Last 3 Encounters:  01/22/17 166 lb 3.2 oz (75.4 kg)  07/21/16 162 lb (73.5 kg)  05/14/16 157 lb 12.8 oz (71.6 kg)    Current Medications:  Current Outpatient Prescriptions on File Prior to Visit  Medication Sig Dispense Refill  . acyclovir (ZOVIRAX) 400 MG tablet TAKE ONE TABLET 3 TIMES A DAY AS NEEDED. 30 tablet 2  . albuterol (PROVENTIL HFA;VENTOLIN HFA) 108 (90 Base) MCG/ACT inhaler Inhale 2 puffs into the lungs every 6 (six) hours as needed for wheezing or shortness of breath. 1 Inhaler 2  . ALPRAZolam (XANAX) 0.5 MG tablet Take 1 tablet (0.5 mg total) by mouth 3 (three) times daily as needed for anxiety. 90 tablet 0  . cetirizine (ZYRTEC) 10 MG tablet Take 10 mg by mouth daily.    . DULoxetine (CYMBALTA) 60 MG capsule Take 1 capsule (60 mg total) by mouth daily. 30 capsule 5  . fluticasone furoate-vilanterol (BREO ELLIPTA) 100-25 MCG/INH AEPB Inhale 1 puff into the lungs daily. Rinse mouth with water after each use 14 each 0  . ipratropium (ATROVENT) 0.03 % nasal spray Place 2 sprays into both nostrils every 12 (twelve) hours. 30 mL 12  . olmesartan-hydrochlorothiazide (BENICAR HCT) 20-12.5 MG tablet Take 1 tablet by mouth daily. 30 tablet 5  . triamcinolone cream (KENALOG) 0.5 % Apply 1 application topically 2 (two) times daily. 80 g 2   No current facility-administered medications on file prior to visit.    Medical History:  Past  Medical History:  Diagnosis Date  . Allergy   . Anxiety   . Depression   . Hypertension 2006   Allergies:  Allergies  Allergen Reactions  . Azithromycin Nausea And Vomiting  . Prednisone     Post menopausal bleeding    Review of Systems:  Review of Systems  Constitutional: Negative.   HENT: Negative.   Eyes: Negative.   Respiratory: Negative.   Cardiovascular: Negative.   Gastrointestinal: Negative.   Genitourinary: Negative.   Musculoskeletal: Negative.   Skin: Negative.   Neurological: Negative.    Endo/Heme/Allergies: Negative.   Psychiatric/Behavioral: Negative.      Family history- Review and unchanged Social history- Review and unchanged Physical Exam: BP 124/80   Pulse 89   Temp 97.7 F (36.5 C)   Resp 16   Ht 5' 1.5" (1.562 m)   Wt 166 lb 3.2 oz (75.4 kg)   LMP 05/30/2013   SpO2 96%   BMI 30.89 kg/m  Wt Readings from Last 3 Encounters:  01/22/17 166 lb 3.2 oz (75.4 kg)  07/21/16 162 lb (73.5 kg)  05/14/16 157 lb 12.8 oz (71.6 kg)   General Appearance: Well nourished, in no apparent distress. Eyes: PERRLA, EOMs, conjunctiva no swelling or erythema Sinuses: No Frontal/maxillary tenderness ENT/Mouth: Ext aud canals clear, TMs without erythema, bulging. No erythema, swelling, or exudate on post pharynx.  Tonsils not swollen or erythematous. Hearing normal.  Neck: Supple, thyroid normal.  Respiratory: Respiratory effort normal, BS equal bilaterally without rales, rhonchi, wheezing or stridor.  Cardio: RRR with no MRGs. Brisk peripheral pulses without edema.  Abdomen: Soft, + BS.  Non tender, no guarding, rebound, hernias, masses. Lymphatics: Non tender without lymphadenopathy.  Musculoskeletal: Full ROM, 5/5 strength, normal gait.  Skin: Warm, dry without rashes, lesions, ecchymosis.  Neuro: Cranial nerves intact. Normal muscle tone, no cerebellar symptoms. Sensation intact.  Psych: Awake and oriented X 3, normal affect, Insight and Judgment appropriate.    Vicie Mutters, PA-C 11:40 AM [facility]

## 2017-02-23 ENCOUNTER — Telehealth: Payer: Self-pay | Admitting: Physician Assistant

## 2017-02-23 MED ORDER — ALPRAZOLAM 0.5 MG PO TABS: 0.5000 mg | ORAL_TABLET | Freq: Three times a day (TID) | ORAL | 0 refills | Status: DC | PRN

## 2017-02-23 NOTE — Telephone Encounter (Signed)
-----   Message from Elenor Quinones, [place] sent at 02/23/2017 10:34 AM EDT ----- Regarding: Duanne Moron REFILL Contact: [phone] Pt would like a refill on xanax.

## 2017-02-23 NOTE — Telephone Encounter (Signed)
Xanax was called into pharmacy on 30th April 2018 @ 12:02 pm by DD

## 2017-04-02 ENCOUNTER — Other Ambulatory Visit: Payer: Self-pay | Admitting: *Deleted

## 2017-04-02 MED ORDER — ALPRAZOLAM 0.5 MG PO TABS: 0.5000 mg | ORAL_TABLET | Freq: Three times a day (TID) | ORAL | 0 refills | Status: DC | PRN

## 2017-05-11 ENCOUNTER — Other Ambulatory Visit: Payer: Self-pay | Admitting: Internal Medicine

## 2017-05-12 NOTE — Telephone Encounter (Signed)
Xanax  was called into pharmacy on 17th July 2018 at 8:25am by DD

## 2017-05-14 NOTE — Progress Notes (Deleted)
Complete Physical  Assessment and Plan: Essential hypertension - continue medications, DASH diet, exercise and monitor at home. Call if greater than 130/80.  - CBC with Differential/Platelet - BASIC METABOLIC PANEL WITH GFR - Hepatic function panel - TSH - Urinalysis, Routine w reflex microscopic (not at [facility]) - Microalbumin / creatinine urine ratio - DG Chest 2 View; Future - EKG 12-Lead - olmesartan-hydrochlorothiazide (BENICAR HCT) 20-12.5 MG tablet; Take 1 tablet by mouth daily.  Dispense: 30 tablet; Refill: 5  Depression, major, in remission ([place]) Depression- continue medications, stress management techniques discussed, increase water, good sleep hygiene discussed, increase exercise, and increase veggies.  - DULoxetine (CYMBALTA) 60 MG capsule; Take 1 capsule (60 mg total) by mouth daily.  Dispense: 30 capsule; Refill: 5  Tobacco use disorder Advised to stop smoking, will get CXR, not ready to quit at this time.  - DG Chest 2 View; Future - EKG 12-Lead  Anxiety Continue xanax PRN, discussed addictive nature and that patient should limit use.    Gastroesophageal reflux disease, esophagitis presence not specified Continue PPI/H2 blocker, diet discussed  Allergy, subsequent encounter Continue OTC meds   Hyperlipidemia -continue medications, check lipids, decrease fatty foods, increase activity.  - Lipid panel  Vitamin D deficiency - VITAMIN D 25 Hydroxy (Vit-D Deficiency, Fractures)   Medication management - Magnesium  Encounter for general adult medical examination with abnormal findings Needs COLONOSCOPY Get MGM   H/O cold sores - acyclovir (ZOVIRAX) 400 MG tablet; TAKE ONE TABLET 3 TIMES A DAY AS NEEDED.  Dispense: 30 tablet; Refill: 2  Discussed med's effects and SE's. Screening labs and tests as requested with regular follow-up as recommended.  HPI 56 y.o. female  presents for a complete physical. She has had elevated blood pressure since 2006. Her blood  pressure has been controlled at home, today their BP is   She does not workout. She denies chest pain, shortness of breath, dizziness.  She continue to smoke 1 pack a day, needs CXR.  She is not on cholesterol medication and denies myalgias. Her cholesterol is at goal. The cholesterol last visit was:   Lab Results  Component Value Date   CHOL 189 05/14/2016   HDL 71 05/14/2016   LDLCALC 96 05/14/2016   TRIG 112 05/14/2016   CHOLHDL 2.7 05/14/2016   Last A1C in the office was:  Lab Results  Component Value Date   HGBA1C 5.5 05/14/2015   Patient was on Vitamin D supplement has not been on it.  Lab Results  Component Value Date   VD25OH 38 05/14/2016   Depression in remission, on cymbalta and will take 1-3 a day of the xanax pending on stress.    Married for 25 years, has 2 kids and 3 grand kids BMI is There is no height or weight on file to calculate BMI., she is working on diet and exercise. Wt Readings from Last 3 Encounters:  01/22/17 166 lb 3.2 oz (75.4 kg)  07/21/16 162 lb (73.5 kg)  05/14/16 157 lb 12.8 oz (71.6 kg)    Current Medications:  Current Outpatient Prescriptions on File Prior to Visit  Medication Sig Dispense Refill  . acyclovir (ZOVIRAX) 400 MG tablet TAKE ONE TABLET 3 TIMES A DAY AS NEEDED. 30 tablet 2  . albuterol (PROVENTIL HFA;VENTOLIN HFA) 108 (90 Base) MCG/ACT inhaler Inhale 2 puffs into the lungs every 6 (six) hours as needed for wheezing or shortness of breath. 1 Inhaler 2  . ALPRAZolam (XANAX) 0.5 MG tablet Take 1 tablet  by mouth UP to 3 x a daily AS NEEDED for anxiety, works better if NOT take 3 x a day, will need to decrease use 90 tablet 0  . cetirizine (ZYRTEC) 10 MG tablet Take 10 mg by mouth daily.    . DULoxetine (CYMBALTA) 60 MG capsule Take 1 capsule (60 mg total) by mouth daily. 30 capsule 5  . fluticasone furoate-vilanterol (BREO ELLIPTA) 100-25 MCG/INH AEPB Inhale 1 puff into the lungs daily. Rinse mouth with water after each use 14 each 0   . ipratropium (ATROVENT) 0.03 % nasal spray Place 2 sprays into both nostrils every 12 (twelve) hours. 30 mL 12  . olmesartan-hydrochlorothiazide (BENICAR HCT) 20-12.5 MG tablet Take 1 tablet by mouth daily. 30 tablet 5  . triamcinolone cream (KENALOG) 0.5 % Apply 1 application topically 2 (two) times daily. 80 g 2   No current facility-administered medications on file prior to visit.    Health Maintenance:   Immunization History  Administered Date(s) Administered  . Tdap 05/09/2013   Tetanus: 2014 Pneumovax: N/A Prevnar 13: N/A Flu vaccine: declines Zostavax: N/A  Pap: 12/2014 US pelvis 12/2014 MGM: 12/2014 normal, DUE DEXA: N/A Colonoscopy: NEEDS want close to [place], early AM appointment or late in the after noon EGD: N/A CXR DUE, will get today  DEE: May 2016 Dr. Charma Igo Patient Care Team: Unk Pinto, MD as PCP - General (Internal Medicine) Crista Luria, MD as Consulting Physician (Dermatology) Mosetta Anis, MD as Referring Physician (Allergy)   Problem List has Hypertension; Depression, major, in remission ([place]); Anxiety; Allergy; GERD (gastroesophageal reflux disease); Tobacco use disorder; and Hyperlipidemia on her problem list.  Allergies Allergies  Allergen Reactions  . Azithromycin Nausea And Vomiting  . Prednisone     Post menopausal bleeding    SURGICAL HISTORY She  has a past surgical history that includes Tonsillectomy and adenoidectomy and Cesarean section. FAMILY HISTORY Her family history includes Heart attack in her father; Hyperlipidemia in her father; Hypertension in her father. SOCIAL HISTORY She  reports that she has been smoking Cigarettes.  She has a 10.00 pack-year smoking history. She does not have any smokeless tobacco history on file. She reports that she drinks about 3.6 oz of alcohol per week . She reports that she does not use drugs.  Review of Systems  Constitutional: Negative.   HENT: Negative.   Eyes:  Negative.   Respiratory: Negative.   Cardiovascular: Negative.   Gastrointestinal: Negative.   Genitourinary: Negative.   Musculoskeletal: Negative.   Skin: Negative.   Neurological: Negative.   Endo/Heme/Allergies: Negative.   Psychiatric/Behavioral: Negative.    Physical Exam: Estimated body mass index is 30.89 kg/m as calculated from the following:   Height as of 01/22/17: 5' 1.5" (1.562 m).   Weight as of 01/22/17: 166 lb 3.2 oz (75.4 kg). LMP 05/30/2013  General Appearance: Well nourished, in no apparent distress. Eyes: PERRLA, EOMs, conjunctiva no swelling or erythema, normal fundi and vessels. Sinuses: No Frontal/maxillary tenderness ENT/Mouth: Ext aud canals clear, normal light reflex with TMs without erythema, bulging.  Good dentition. No erythema, swelling, or exudate on post pharynx. Tonsils not swollen or erythematous. Hearing normal.  Neck: Supple, thyroid normal. No bruits Respiratory: Respiratory effort normal, BS equal bilaterally without rales, rhonchi, wheezing or stridor. Cardio: RRR without murmurs, rubs or gallops. Brisk peripheral pulses without edema.  Chest: symmetric, with normal excursions and percussion. Breasts: Symmetric, + FBD, without nipple discharge, retractions. Abdomen: Soft, +BS. Non tender, no guarding, rebound, hernias, masses,  or organomegaly. .  Lymphatics: Non tender without lymphadenopathy.  Genitourinary: defer Musculoskeletal: Full ROM all peripheral extremities,5/5 strength, and normal gait. Skin: Right supraclavicular lipoma 58mm x 48mm. Warm, dry without rashes, lesions, ecchymosis.  Neuro: Cranial nerves intact, reflexes equal bilaterally. Normal muscle tone, no cerebellar symptoms. Sensation intact.  Psych: Awake and oriented X 3, normal affect, Insight and Judgment appropriate.   EKG: WNL no changes. AORTA SCAN:  defer   Vicie Mutters 7:39 AM

## 2017-05-18 ENCOUNTER — Encounter: Payer: Self-pay | Admitting: Physician Assistant

## 2017-06-01 NOTE — Progress Notes (Signed)
3 month OV  Assessment and Plan: Essential hypertension - continue medications, DASH diet, exercise and monitor at home. Call if greater than 130/80.  - will increase benicar to 40/25 and will do 1/2  Depression, major, in remission ([place]) Depression- continue medications, stress management techniques discussed, increase water, good sleep hygiene discussed, increase exercise, and increase veggies.  - DULoxetine (CYMBALTA) 60 MG capsule; Take 1 capsule (60 mg total) by mouth daily.  Dispense: 30 capsule; Refill: 5  Tobacco use disorder Advised to stop smoking,  not ready to quit at this time but she is cutting back  Anxiety Continue xanax PRN, discussed addictive nature and that patient should limit use.    Gastroesophageal reflux disease, esophagitis presence not specified Continue PPI/H2 blocker, diet discussed  Allergy, subsequent encounter Continue OTC meds   Hyperlipidemia -decrease fatty foods, increase activity.   Vitamin D deficiency   Medication management  Encounter for general adult medical examination with abnormal findings Needs COLONOSCOPY NEEDS MGM  Discussed med's effects and SE's. Screening labs and tests as requested with regular follow-up as recommended.  HPI 56 y.o. female  presents for a complete physical. She has had elevated blood pressure since 2006. Her blood pressure has been controlled at home, today their BP is BP: 126/82 She does not workout. She denies chest pain, shortness of breath, dizziness.  She continue to smoke  1/2 pack a day.  She is not on cholesterol medication and denies myalgias. Her cholesterol is at goal. The cholesterol last visit was:   Lab Results  Component Value Date   CHOL 189 05/14/2016   HDL 71 05/14/2016   LDLCALC 96 05/14/2016   TRIG 112 05/14/2016   CHOLHDL 2.7 05/14/2016   Last A1C in the office was:  Lab Results  Component Value Date   HGBA1C 5.5 05/14/2015   Patient was on Vitamin D supplement has not been  on it.  Lab Results  Component Value Date   VD25OH 38 05/14/2016   Depression in remission, on cymbalta.  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  BMI is Body mass index is 30 kg/m., she is working on diet and exercise. Wt Readings from Last 3 Encounters:  06/02/17 161 lb 6.4 oz (73.2 kg)  01/22/17 166 lb 3.2 oz (75.4 kg)  07/21/16 162 lb (73.5 kg)    Current Medications:  Current Outpatient Prescriptions on File Prior to Visit  Medication Sig Dispense Refill  . acyclovir (ZOVIRAX) 400 MG tablet TAKE ONE TABLET 3 TIMES A DAY AS NEEDED. 30 tablet 2  . albuterol (PROVENTIL HFA;VENTOLIN HFA) 108 (90 Base) MCG/ACT inhaler Inhale 2 puffs into the lungs every 6 (six) hours as needed for wheezing or shortness of breath. 1 Inhaler 2  . ALPRAZolam (XANAX) 0.5 MG tablet Take 1 tablet by mouth UP to 3 x a daily AS NEEDED for anxiety, works better if NOT take 3 x a day, will need to decrease use 90 tablet 0  . cetirizine (ZYRTEC) 10 MG tablet Take 10 mg by mouth daily.    . DULoxetine (CYMBALTA) 60 MG capsule Take 1 capsule (60 mg total) by mouth daily. 30 capsule 5  . fluticasone furoate-vilanterol (BREO ELLIPTA) 100-25 MCG/INH AEPB Inhale 1 puff into the lungs daily. Rinse mouth with water after each use 14 each 0  . ipratropium (ATROVENT) 0.03 % nasal spray Place 2 sprays into both nostrils every 12 (  twelve) hours. 30 mL 12  . triamcinolone cream (KENALOG) 0.5 % Apply 1 application topically 2 (two) times daily. 80 g 2   No current facility-administered medications on file prior to visit.    Health Maintenance:   Immunization History  Administered Date(s) Administered  . Tdap 05/09/2013   Tetanus: 2014 Pneumovax: N/A Prevnar 13: N/A Flu vaccine: declines Zostavax: N/A  Pap: 12/2014 US pelvis 12/2014 MGM: 12/2014 normal, DUE DEXA: N/A Colonoscopy: NEEDS want close to [place], early AM appointment or late  in the after noon EGD: N/A CXR 06/2016  DEE: May 2016 Dr. Charma Igo Patient Care Team: Unk Pinto, MD as PCP - General (Internal Medicine) Crista Luria, MD as Consulting Physician (Dermatology) Mosetta Anis, MD as Referring Physician (Allergy)   Problem List has Hypertension; Depression, major, in remission ([place]); Anxiety; Allergy; GERD (gastroesophageal reflux disease); Tobacco use disorder; and Hyperlipidemia on her problem list.  Allergies Allergies  Allergen Reactions  . Azithromycin Nausea And Vomiting  . Prednisone     Post menopausal bleeding    SURGICAL HISTORY She  has a past surgical history that includes Tonsillectomy and adenoidectomy and Cesarean section. FAMILY HISTORY Her family history includes Heart attack in her father; Hyperlipidemia in her father; Hypertension in her father. SOCIAL HISTORY She  reports that she has been smoking Cigarettes.  She has a 10.00 pack-year smoking history. She does not have any smokeless tobacco history on file. She reports that she drinks about 3.6 oz of alcohol per week . She reports that she does not use drugs.  Review of Systems  Constitutional: Negative.   HENT: Negative.   Eyes: Negative.   Respiratory: Negative.   Cardiovascular: Negative.   Gastrointestinal: Negative.   Genitourinary: Negative.   Musculoskeletal: Negative.   Skin: Negative.   Neurological: Negative.   Endo/Heme/Allergies: Negative.   Psychiatric/Behavioral: Negative.    Physical Exam: Estimated body mass index is 30 kg/m as calculated from the following:   Height as of this encounter: 5' 1.5" (1.562 m).   Weight as of this encounter: 161 lb 6.4 oz (73.2 kg). BP 126/82   Pulse 99   Temp 97.9 F (36.6 C)   Ht 5' 1.5" (1.562 m)   Wt 161 lb 6.4 oz (73.2 kg)   LMP 05/30/2013   SpO2 97%   BMI 30.00 kg/m  General Appearance: Well nourished, in no apparent distress. Eyes: PERRLA, EOMs, conjunctiva no swelling or erythema, normal  fundi and vessels. Sinuses: No Frontal/maxillary tenderness ENT/Mouth: Ext aud canals clear, normal light reflex with TMs without erythema, bulging.  Good dentition. No erythema, swelling, or exudate on post pharynx. Tonsils not swollen or erythematous. Hearing normal.  Neck: Supple, thyroid normal. No bruits Respiratory: Respiratory effort normal, BS equal bilaterally without rales, rhonchi, wheezing or stridor. Cardio: RRR without murmurs, rubs or gallops. Brisk peripheral pulses without edema.  Chest: symmetric, with normal excursions and percussion. Breasts: Symmetric, + FBD, without nipple discharge, retractions. Abdomen: Soft, +BS. Non tender, no guarding, rebound, hernias, masses, or organomegaly. .  Lymphatics: Non tender without lymphadenopathy.  Genitourinary: defer Musculoskeletal: Full ROM all peripheral extremities,5/5 strength, and normal gait. Skin: Right supraclavicular lipoma 29mm x 45mm. Warm, dry without rashes, lesions, ecchymosis.  Neuro: Cranial nerves intact, reflexes equal bilaterally. Normal muscle tone, no cerebellar symptoms. Sensation intact.  Psych: Awake and oriented X 3, normal affect, Insight and Judgment appropriate.   EKG: defer AORTA SCAN:  defer   Vicie Mutters 2:22 PM

## 2017-06-02 ENCOUNTER — Ambulatory Visit (INDEPENDENT_AMBULATORY_CARE_PROVIDER_SITE_OTHER): Payer: Self-pay | Admitting: Physician Assistant

## 2017-06-02 VITALS — BP 126/82 | HR 99 | Temp 97.9°F | Ht 61.5 in | Wt 161.4 lb

## 2017-06-02 DIAGNOSIS — I10 Essential (primary) hypertension: Secondary | ICD-10-CM

## 2017-06-02 DIAGNOSIS — Z0001 Encounter for general adult medical examination with abnormal findings: Secondary | ICD-10-CM

## 2017-06-02 DIAGNOSIS — E785 Hyperlipidemia, unspecified: Secondary | ICD-10-CM

## 2017-06-02 DIAGNOSIS — E559 Vitamin D deficiency, unspecified: Secondary | ICD-10-CM

## 2017-06-02 DIAGNOSIS — K219 Gastro-esophageal reflux disease without esophagitis: Secondary | ICD-10-CM

## 2017-06-02 DIAGNOSIS — Z79899 Other long term (current) drug therapy: Secondary | ICD-10-CM

## 2017-06-02 DIAGNOSIS — T7840XD Allergy, unspecified, subsequent encounter: Secondary | ICD-10-CM

## 2017-06-02 DIAGNOSIS — F17200 Nicotine dependence, unspecified, uncomplicated: Secondary | ICD-10-CM

## 2017-06-02 DIAGNOSIS — F325 Major depressive disorder, single episode, in full remission: Secondary | ICD-10-CM

## 2017-06-02 DIAGNOSIS — F419 Anxiety disorder, unspecified: Secondary | ICD-10-CM

## 2017-06-02 LAB — CBC WITH DIFFERENTIAL/PLATELET
BASOS ABS: 0 {cells}/uL (ref 0–200)
Basophils Relative: 0 %
EOS PCT: 3 %
Eosinophils Absolute: 183 cells/uL (ref 15–500)
HCT: 48.6 % — ABNORMAL HIGH (ref 35.0–45.0)
HEMOGLOBIN: 16.4 g/dL — AB (ref 11.7–15.5)
LYMPHS PCT: 38 %
Lymphs Abs: 2318 cells/uL (ref 850–3900)
MCH: 32.7 pg (ref 27.0–33.0)
MCHC: 33.7 g/dL (ref 32.0–36.0)
MCV: 97 fL (ref 80.0–100.0)
MONOS PCT: 7 %
MPV: 8.9 fL (ref 7.5–12.5)
Monocytes Absolute: 427 cells/uL (ref 200–950)
NEUTROS PCT: 52 %
Neutro Abs: 3172 cells/uL (ref 1500–7800)
PLATELETS: 267 10*3/uL (ref 140–400)
RBC: 5.01 MIL/uL (ref 3.80–5.10)
RDW: 14.1 % (ref 11.0–15.0)
WBC: 6.1 10*3/uL (ref 3.8–10.8)

## 2017-06-02 LAB — COMPREHENSIVE METABOLIC PANEL
ALK PHOS: 77 U/L (ref 33–130)
ALT: 16 U/L (ref 6–29)
AST: 21 U/L (ref 10–35)
Albumin: 4.4 g/dL (ref 3.6–5.1)
BUN: 15 mg/dL (ref 7–25)
CO2: 25 mmol/L (ref 20–32)
Calcium: 9.7 mg/dL (ref 8.6–10.4)
Chloride: 100 mmol/L (ref 98–110)
Creat: 0.79 mg/dL (ref 0.50–1.05)
Glucose, Bld: 83 mg/dL (ref 65–99)
POTASSIUM: 4.3 mmol/L (ref 3.5–5.3)
Sodium: 138 mmol/L (ref 135–146)
TOTAL PROTEIN: 7.1 g/dL (ref 6.1–8.1)
Total Bilirubin: 0.5 mg/dL (ref 0.2–1.2)

## 2017-06-02 MED ORDER — OLMESARTAN MEDOXOMIL-HCTZ 40-25 MG PO TABS: ORAL_TABLET | ORAL | 3 refills | Status: DC

## 2017-06-02 NOTE — Patient Instructions (Addendum)
[facility]  7 a.m.-6:30 p.m., Monday 7 a.m.-5 p.m., Tuesday-Friday Schedule an appointment by calling [phone].  [facility] Schedule an appointment by calling [phone].  Or can get $99 dollar mammogram from [facility]  Colon cancer is 3rd most diagnosed cancer and 2nd leading cause of death in both men and women 56 years of age and older despite being one of the most preventable and treatable cancers if found early.  4 of out 5 people diagnosed with colon cancer have NO prior family history.  When caught EARLY 90% of colon cancer is curable.   Need to get when you have insurance.

## 2017-06-15 ENCOUNTER — Other Ambulatory Visit: Payer: Self-pay | Admitting: Physician Assistant

## 2017-06-15 MED ORDER — ALPRAZOLAM 0.5 MG PO TABS: ORAL_TABLET | ORAL | 0 refills | Status: DC

## 2017-06-15 NOTE — Progress Notes (Unsigned)
Future Appointments  Date Time Provider Department Center  12/03/2017  3:30 PM Maleah Rabago, PA-C [facility] None  06/09/2018  2:00 PM Amely Voorheis, PA-C [facility] None    

## 2017-06-15 NOTE — Progress Notes (Signed)
Xanax called into pharmacy on Aug 20th 2018 at 1:45pm By DD

## 2017-06-25 ENCOUNTER — Other Ambulatory Visit: Payer: Self-pay

## 2017-06-25 DIAGNOSIS — F325 Major depressive disorder, single episode, in full remission: Secondary | ICD-10-CM

## 2017-06-25 MED ORDER — DULOXETINE HCL 60 MG PO CPEP: 60.0000 mg | ORAL_CAPSULE | Freq: Every day | ORAL | 5 refills | Status: DC

## 2017-07-13 ENCOUNTER — Other Ambulatory Visit: Payer: Self-pay | Admitting: Physician Assistant

## 2017-07-13 MED ORDER — ALPRAZOLAM 0.5 MG PO TABS: ORAL_TABLET | ORAL | 0 refills | Status: DC

## 2017-07-13 NOTE — Progress Notes (Unsigned)
Future Appointments Date Time Provider [place]  12/03/2017 3:30 PM Vicie Mutters, PA-C [facility] None  06/09/2018 2:00 PM Vicie Mutters, PA-C [facility] None

## 2017-07-13 NOTE — Progress Notes (Signed)
Xanax called into pharmacy on 17th Sept 2018 by DD at 2:20pm

## 2017-08-12 ENCOUNTER — Other Ambulatory Visit: Payer: Self-pay | Admitting: Physician Assistant

## 2017-08-12 MED ORDER — ALPRAZOLAM 0.5 MG PO TABS: ORAL_TABLET | ORAL | 0 refills | Status: DC

## 2017-08-13 NOTE — Progress Notes (Signed)
Xanax has been called into pharmacy 18th Oct 2018 by DD

## 2017-09-24 ENCOUNTER — Other Ambulatory Visit: Payer: Self-pay | Admitting: Physician Assistant

## 2017-09-24 MED ORDER — ALPRAZOLAM 0.5 MG PO TABS: ORAL_TABLET | ORAL | 0 refills | Status: DC

## 2017-09-24 NOTE — Progress Notes (Signed)
Rx called in 

## 2017-09-24 NOTE — Progress Notes (Unsigned)
Future Appointments  Date Time Provider [place]  12/03/2017  3:30 PM Vicie Mutters, PA-C [facility] None  06/09/2018  2:00 PM Vicie Mutters, PA-C [facility] None

## 2017-10-23 ENCOUNTER — Other Ambulatory Visit: Payer: Self-pay | Admitting: Physician Assistant

## 2017-10-23 MED ORDER — ALPRAZOLAM 0.5 MG PO TABS: ORAL_TABLET | ORAL | 0 refills | Status: DC

## 2017-12-01 ENCOUNTER — Telehealth: Payer: Self-pay | Admitting: Physician Assistant

## 2017-12-01 NOTE — Telephone Encounter (Signed)
-----   Message from Elenor Quinones, [place] sent at 11/30/2017  3:38 PM EST ----- Regarding: refill Refill on Alprazolam please.

## 2017-12-02 ENCOUNTER — Other Ambulatory Visit: Payer: Self-pay | Admitting: Internal Medicine

## 2017-12-02 DIAGNOSIS — F419 Anxiety disorder, unspecified: Secondary | ICD-10-CM

## 2017-12-02 MED ORDER — ALPRAZOLAM 0.5 MG PO TABS: ORAL_TABLET | ORAL | 0 refills | Status: DC

## 2017-12-03 ENCOUNTER — Ambulatory Visit: Payer: Self-pay | Admitting: Physician Assistant

## 2017-12-08 NOTE — Telephone Encounter (Signed)
LVM to ascertain what pharmacy? Feb 12th 2019 by DD

## 2017-12-31 ENCOUNTER — Other Ambulatory Visit: Payer: Self-pay | Admitting: Internal Medicine

## 2017-12-31 DIAGNOSIS — F419 Anxiety disorder, unspecified: Secondary | ICD-10-CM

## 2018-01-14 ENCOUNTER — Ambulatory Visit: Payer: Self-pay | Admitting: Physician Assistant

## 2018-01-14 ENCOUNTER — Encounter: Payer: Self-pay | Admitting: Physician Assistant

## 2018-01-14 VITALS — BP 130/72 | HR 60 | Temp 97.6°F | Resp 14 | Ht 61.6 in | Wt 159.2 lb

## 2018-01-14 DIAGNOSIS — F17200 Nicotine dependence, unspecified, uncomplicated: Secondary | ICD-10-CM

## 2018-01-14 DIAGNOSIS — Z79899 Other long term (current) drug therapy: Secondary | ICD-10-CM

## 2018-01-14 DIAGNOSIS — E785 Hyperlipidemia, unspecified: Secondary | ICD-10-CM

## 2018-01-14 DIAGNOSIS — I10 Essential (primary) hypertension: Secondary | ICD-10-CM

## 2018-01-14 DIAGNOSIS — F325 Major depressive disorder, single episode, in full remission: Secondary | ICD-10-CM

## 2018-01-14 NOTE — Progress Notes (Signed)
3 month OV She is self pay  Assessment and Plan: Essential hypertension - continue medications, DASH diet, exercise and monitor at home. Call if greater than 130/80.  - will increase benicar to 40/25 and will do 1/2  Depression, major, in remission ([place]) Depression- continue medications, stress management techniques discussed, increase water, good sleep hygiene discussed, increase exercise, and increase veggies.  - DULoxetine (CYMBALTA) 60 MG capsule; Take 1 capsule (60 mg total) by mouth daily.  Dispense: 30 capsule; Refill: 5  Tobacco use disorder Advised to stop smoking,  not ready to quit at this time but she is cutting back   Hyperlipidemia -decrease fatty foods, increase activity.   Vitamin D deficiency   Medication management  Discussed med's effects and SE's. Screening labs and tests as requested with regular follow-up as recommended. Future Appointments  Date Time Provider [place]  06/09/2018  2:00 PM Vicie Mutters, PA-C [facility] None    HPI 57 y.o. female  presents for a follow up.  She has had elevated blood pressure since 2006. Her blood pressure has been controlled at home, today their BP is BP: 130/72 She does not workout. She denies chest pain, shortness of breath, dizziness.  She continue to smoke  1/2 pack a day.  She is not on cholesterol medication and denies myalgias. Her cholesterol is at goal. The cholesterol last visit was:   Lab Results  Component Value Date   CHOL 189 05/14/2016   HDL 71 05/14/2016   LDLCALC 96 05/14/2016   TRIG 112 05/14/2016   CHOLHDL 2.7 05/14/2016   Last A1C in the office was:  Lab Results  Component Value Date   HGBA1C 5.5 05/14/2015   Patient was on Vitamin D supplement has not been on it.  Lab Results  Component Value Date   VD25OH 38 05/14/2016   Depression in remission, on cymbalta. Will take xanax 1-2 x a day. 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  BMI is Body mass index is 29.5 kg/m., she is working on diet and exercise. Wt Readings from Last 3 Encounters:  01/14/18 159 lb 3.2 oz (72.2 kg)  06/02/17 161 lb 6.4 oz (73.2 kg)  01/22/17 166 lb 3.2 oz (75.4 kg)    Current Medications:  Current Outpatient Medications on File Prior to Visit  Medication Sig Dispense Refill  . acyclovir (ZOVIRAX) 400 MG tablet TAKE ONE TABLET 3 TIMES A DAY AS NEEDED. 30 tablet 2  . albuterol (PROVENTIL HFA;VENTOLIN HFA) 108 (90 Base) MCG/ACT inhaler Inhale 2 puffs into the lungs every 6 (six) hours as needed for wheezing or shortness of breath. 1 Inhaler 2  . ALPRAZolam (XANAX) 0.5 MG tablet TAKE 1/2 TO 1 TAB BY MOUTH 2X-3XDAY AS NEEDED FOR ANXIETY ATTACK. AVOID ADDICTION LIMIT TO 5 DAYS/WK 75 tablet 0  . cetirizine (ZYRTEC) 10 MG tablet Take 10 mg by mouth daily.    . DULoxetine (CYMBALTA) 60 MG capsule Take 1 capsule (60 mg total) by mouth daily. 30 capsule 5  . fluticasone furoate-vilanterol (BREO ELLIPTA) 100-25 MCG/INH AEPB Inhale 1 puff into the lungs daily. Rinse mouth with water after each use 14 each 0  . ipratropium (ATROVENT) 0.03 % nasal spray Place 2 sprays into both nostrils every 12 (twelve) hours. 30 mL 12  . olmesartan-hydrochlorothiazide (BENICAR HCT) 40-25 MG tablet 1/2-1 tablet for BP 30 tablet 3  . triamcinolone cream (KENALOG) 0.5 %  Apply 1 application topically 2 (two) times daily. 80 g 2   No current facility-administered medications on file prior to visit.    Problem List has Hypertension; Depression, major, in remission ([place]); Anxiety; Allergy; GERD (gastroesophageal reflux disease); Tobacco use disorder; and Hyperlipidemia on their problem list.  Allergies Allergies  Allergen Reactions  . Azithromycin Nausea And Vomiting  . Prednisone     Post menopausal bleeding   Surgical History: reviewed and unchanged Family History: reviewed and unchanged Social History: reviewed and unchanged  Review of  Systems  Constitutional: Negative.   HENT: Negative.   Eyes: Negative.   Respiratory: Negative.   Cardiovascular: Negative.   Gastrointestinal: Negative.   Genitourinary: Negative.   Musculoskeletal: Negative.   Skin: Negative.   Neurological: Negative.   Endo/Heme/Allergies: Negative.   Psychiatric/Behavioral: Negative.    Physical Exam: Estimated body mass index is 29.5 kg/m as calculated from the following:   Height as of this encounter: 5' 1.6" (1.565 m).   Weight as of this encounter: 159 lb 3.2 oz (72.2 kg). BP 130/72   Pulse 60   Temp 97.6 F (36.4 C)   Resp 14   Ht 5' 1.6" (1.565 m)   Wt 159 lb 3.2 oz (72.2 kg)   LMP 05/30/2013   SpO2 96%   BMI 29.50 kg/m  General Appearance: Well nourished, in no apparent distress. Eyes: PERRLA, EOMs, conjunctiva no swelling or erythema, normal fundi and vessels. Sinuses: No Frontal/maxillary tenderness ENT/Mouth: Ext aud canals clear, normal light reflex with TMs without erythema, bulging.  Good dentition. No erythema, swelling, or exudate on post pharynx. Tonsils not swollen or erythematous. Hearing normal.  Neck: Supple, thyroid normal. No bruits Respiratory: Respiratory effort normal, BS equal bilaterally without rales, rhonchi, wheezing or stridor. Cardio: RRR without murmurs, rubs or gallops. Brisk peripheral pulses without edema.  Chest: symmetric, with normal excursions and percussion. Breasts: Symmetric, + FBD, without nipple discharge, retractions. Abdomen: Soft, +BS. Non tender, no guarding, rebound, hernias, masses, or organomegaly. .  Lymphatics: Non tender without lymphadenopathy.  Genitourinary: defer Musculoskeletal: Full ROM all peripheral extremities,5/5 strength, and normal gait. Skin: Right supraclavicular lipoma 99mm x 22mm. Warm, dry without rashes, lesions, ecchymosis.  Neuro: Cranial nerves intact, reflexes equal bilaterally. Normal muscle tone, no cerebellar symptoms. Sensation intact.  Psych: Awake and  oriented X 3, normal affect, Insight and Judgment appropriate.    Vicie Mutters 3:42 PM

## 2018-01-14 NOTE — Patient Instructions (Signed)
New guidelines suggest the benzodiazepines are best short term, with prolonged use they lead to physical and psychological dependence. In addition, evidence suggest that for insomnia the effectiveness wanes in 4 weeks and the risks out weight their benefits. Use of these agents have been associated with dementia, falls, motor vehicle accidents and physical addiction. Decreasing these medication have been proven to show improvements in cognition, alertness, decrease of falls and daytime sedation.   We will start a slow taper, symptoms of withdrawal include, insomnia, anxiety, irritability, sweating and stomach or intestinal symptoms like diarrhea or nausea.   Try to cut back on xanax use to decrease risk of dependence and addiction.   Monitor your blood pressure at home, please keep a record and bring that in with you to your next office visit.   Go to the ER if any CP, SOB, nausea, dizziness, severe HA, changes vision/speech  Due to a recent study, SPRINT, we have changed our goal for the systolic or top blood pressure number. Ideally we want your top number at 120.  In the [facility] Trial, 5000 people were randomized to a goal BP of 120 and 5000 people were randomized to a goal BP of less than 140. The patients with the goal BP at 120 had LESS DEMENTIA, LESS HEART ATTACKS, AND LESS STROKES, AS WELL AS OVERALL DECREASED MORTALITY OR DEATH RATE.   If you are willing, our goal BP is the top number of 120.  Your most recent BP: BP: 130/72   Take your medications faithfully as instructed. Maintain a healthy weight. Get at least 150 minutes of aerobic exercise per week. Minimize salt intake. Minimize alcohol intake  DASH Eating Plan DASH stands for "Dietary Approaches to Stop Hypertension." The DASH eating plan is a healthy eating plan that has been shown to reduce high blood pressure (hypertension). Additional health benefits may include reducing the risk of type 2 diabetes mellitus, heart disease,  and stroke. The DASH eating plan may also help with weight loss. WHAT DO I NEED TO KNOW ABOUT THE DASH EATING PLAN? For the DASH eating plan, you will follow these general guidelines:  Choose foods with a percent daily value for sodium of less than 5% (as listed on the food label).  Use salt-free seasonings or herbs instead of table salt or sea salt.  Check with your health care provider or pharmacist before using salt substitutes.  Eat lower-sodium products, often labeled as "lower sodium" or "no salt added."  Eat fresh foods.  Eat more vegetables, fruits, and low-fat dairy products.  Choose whole grains. Look for the word "whole" as the first word in the ingredient list.  Choose fish and skinless chicken or [place] more often than red meat. Limit fish, poultry, and meat to 6 oz (170 g) each day.  Limit sweets, desserts, sugars, and sugary drinks.  Choose heart-healthy fats.  Limit cheese to 1 oz (28 g) per day.  Eat more home-cooked food and less restaurant, buffet, and fast food.  Limit fried foods.  Cook foods using methods other than frying.  Limit canned vegetables. If you do use them, rinse them well to decrease the sodium.  When eating at a restaurant, ask that your food be prepared with less salt, or no salt if possible. WHAT FOODS CAN I EAT? Seek help from a dietitian for individual calorie needs. Grains Whole grain or whole wheat bread. Brown rice. Whole grain or whole wheat pasta. Quinoa, bulgur, and whole grain cereals. Low-sodium cereals. Corn  or whole wheat flour tortillas. Whole grain cornbread. Whole grain crackers. Low-sodium crackers. Vegetables Fresh or frozen vegetables (raw, steamed, roasted, or grilled). Low-sodium or reduced-sodium tomato and vegetable juices. Low-sodium or reduced-sodium tomato sauce and paste. Low-sodium or reduced-sodium canned vegetables.  Fruits All fresh, canned (in natural juice), or frozen fruits. Meat and Other Protein  Products Ground beef (85% or leaner), grass-fed beef, or beef trimmed of fat. Skinless chicken or [place]. Ground chicken or [place]. Pork trimmed of fat. All fish and seafood. Eggs. Dried beans, peas, or lentils. Unsalted nuts and seeds. Unsalted canned beans. Dairy Low-fat dairy products, such as skim or 1% milk, 2% or reduced-fat cheeses, low-fat ricotta or cottage cheese, or plain low-fat yogurt. Low-sodium or reduced-sodium cheeses. Fats and Oils Tub margarines without trans fats. Light or reduced-fat mayonnaise and salad dressings (reduced sodium). Avocado. Safflower, olive, or canola oils. Natural peanut or almond butter. Other Unsalted popcorn and pretzels. The items listed above may not be a complete list of recommended foods or beverages. Contact your dietitian for more options. WHAT FOODS ARE NOT RECOMMENDED? Grains White bread. White pasta. White rice. Refined cornbread. Bagels and croissants. Crackers that contain trans fat. Vegetables Creamed or fried vegetables. Vegetables in a cheese sauce. Regular canned vegetables. Regular canned tomato sauce and paste. Regular tomato and vegetable juices. Fruits Dried fruits. Canned fruit in light or heavy syrup. Fruit juice. Meat and Other Protein Products Fatty cuts of meat. Ribs, chicken wings, bacon, sausage, bologna, salami, chitterlings, fatback, hot dogs, bratwurst, and packaged luncheon meats. Salted nuts and seeds. Canned beans with salt. Dairy Whole or 2% milk, cream, half-and-half, and cream cheese. Whole-fat or sweetened yogurt. Full-fat cheeses or blue cheese. Nondairy creamers and whipped toppings. Processed cheese, cheese spreads, or cheese curds. Condiments Onion and garlic salt, seasoned salt, table salt, and sea salt. Canned and packaged gravies. Worcestershire sauce. Tartar sauce. Barbecue sauce. Teriyaki sauce. Soy sauce, including reduced sodium. Steak sauce. Fish sauce. Oyster sauce. Cocktail sauce. Horseradish. Ketchup and  mustard. Meat flavorings and tenderizers. Bouillon cubes. Hot sauce. Tabasco sauce. Marinades. Taco seasonings. Relishes. Fats and Oils Butter, stick margarine, lard, shortening, ghee, and bacon fat. Coconut, palm kernel, or palm oils. Regular salad dressings. Other Pickles and olives. Salted popcorn and pretzels. The items listed above may not be a complete list of foods and beverages to avoid. Contact your dietitian for more information. WHERE CAN I FIND MORE INFORMATION? National Heart, Lung, and Blood Institute: travelstabloid.com Document Released: 10/02/2011 Document Revised: 02/27/2014 Document Reviewed: 08/17/2013 [facility] Patient Information 2015 [place], [place]. This information is not intended to replace advice given to you by your health care provider. Make sure you discuss any questions you have with your health care provider.

## 2018-01-15 LAB — CBC WITH DIFFERENTIAL/PLATELET
BASOS PCT: 1 %
Basophils Absolute: 73 cells/uL (ref 0–200)
EOS ABS: 489 {cells}/uL (ref 15–500)
Eosinophils Relative: 6.7 %
HCT: 42.9 % (ref 35.0–45.0)
Hemoglobin: 15.5 g/dL (ref 11.7–15.5)
Lymphs Abs: 2723 cells/uL (ref 850–3900)
MCH: 33 pg (ref 27.0–33.0)
MCHC: 36.1 g/dL — ABNORMAL HIGH (ref 32.0–36.0)
MCV: 91.3 fL (ref 80.0–100.0)
MPV: 9.5 fL (ref 7.5–12.5)
Monocytes Relative: 7.3 %
Neutro Abs: 3482 cells/uL (ref 1500–7800)
Neutrophils Relative %: 47.7 %
PLATELETS: 317 10*3/uL (ref 140–400)
RBC: 4.7 10*6/uL (ref 3.80–5.10)
RDW: 12.6 % (ref 11.0–15.0)
TOTAL LYMPHOCYTE: 37.3 %
WBC mixed population: 533 cells/uL (ref 200–950)
WBC: 7.3 10*3/uL (ref 3.8–10.8)

## 2018-01-15 LAB — TSH: TSH: 1.35 m[IU]/L (ref 0.40–4.50)

## 2018-01-15 LAB — HEPATIC FUNCTION PANEL
AG RATIO: 1.8 (calc) (ref 1.0–2.5)
ALBUMIN MSPROF: 4.5 g/dL (ref 3.6–5.1)
ALT: 10 U/L (ref 6–29)
AST: 17 U/L (ref 10–35)
Alkaline phosphatase (APISO): 92 U/L (ref 33–130)
Bilirubin, Direct: 0.1 mg/dL (ref 0.0–0.2)
GLOBULIN: 2.5 g/dL (ref 1.9–3.7)
Indirect Bilirubin: 0.2 mg/dL (calc) (ref 0.2–1.2)
TOTAL PROTEIN: 7 g/dL (ref 6.1–8.1)
Total Bilirubin: 0.3 mg/dL (ref 0.2–1.2)

## 2018-01-15 LAB — BASIC METABOLIC PANEL WITH GFR
BUN: 13 mg/dL (ref 7–25)
CALCIUM: 9.5 mg/dL (ref 8.6–10.4)
CHLORIDE: 105 mmol/L (ref 98–110)
CO2: 30 mmol/L (ref 20–32)
CREATININE: 0.83 mg/dL (ref 0.50–1.05)
GFR, Est African American: 91 mL/min/{1.73_m2} (ref >=60)
GFR, Est Non African American: 79 mL/min/{1.73_m2} (ref >=60)
GLUCOSE: 101 mg/dL — AB (ref 65–99)
Potassium: 4.8 mmol/L (ref 3.5–5.3)
Sodium: 142 mmol/L (ref 135–146)

## 2018-01-25 ENCOUNTER — Other Ambulatory Visit: Payer: Self-pay

## 2018-01-25 DIAGNOSIS — F325 Major depressive disorder, single episode, in full remission: Secondary | ICD-10-CM

## 2018-01-25 MED ORDER — DULOXETINE HCL 60 MG PO CPEP: 60.0000 mg | ORAL_CAPSULE | Freq: Every day | ORAL | 0 refills | Status: DC

## 2018-02-11 ENCOUNTER — Other Ambulatory Visit: Payer: Self-pay | Admitting: Physician Assistant

## 2018-02-11 DIAGNOSIS — F419 Anxiety disorder, unspecified: Secondary | ICD-10-CM

## 2018-03-15 ENCOUNTER — Other Ambulatory Visit: Payer: Self-pay | Admitting: Physician Assistant

## 2018-03-15 DIAGNOSIS — F419 Anxiety disorder, unspecified: Secondary | ICD-10-CM

## 2018-04-13 ENCOUNTER — Other Ambulatory Visit: Payer: Self-pay | Admitting: Internal Medicine

## 2018-04-13 DIAGNOSIS — F419 Anxiety disorder, unspecified: Secondary | ICD-10-CM

## 2018-04-22 ENCOUNTER — Other Ambulatory Visit: Payer: Self-pay | Admitting: Physician Assistant

## 2018-04-22 DIAGNOSIS — F325 Major depressive disorder, single episode, in full remission: Secondary | ICD-10-CM

## 2018-05-18 ENCOUNTER — Encounter: Payer: Self-pay | Admitting: Physician Assistant

## 2018-06-08 NOTE — Progress Notes (Signed)
CPE Assessment and Plan: Essential hypertension - continue medications, DASH diet, exercise and monitor at home. Call if greater than 130/80.  - will increase benicar to 40/25 and will do 1/2  Depression, major, in remission ([place]) Depression- continue medications, stress management techniques discussed, increase water, good sleep hygiene discussed, increase exercise, and increase veggies.  - DULoxetine (CYMBALTA) 60 MG capsule; Take 1 capsule (60 mg total) by mouth daily.  Dispense: 30 capsule; Refill: 5  Tobacco use disorder Advised to stop smoking,  not ready to quit at this time but she is cutting back  Anxiety Continue xanax PRN, discussed addictive nature and that patient should limit use.    Gastroesophageal reflux disease, esophagitis presence not specified Continue PPI/H2 blocker, diet discussed  Allergy, subsequent encounter Continue OTC meds   Hyperlipidemia -decrease fatty foods, increase activity.   Vitamin D deficiency   Medication management  Encounter for general adult medical examination with abnormal findings Needs COLONOSCOPY- wants to wait until insurance NEEDS MGM- will check [employer]  Discussed med's effects and SE's. Screening labs and tests as requested with regular follow-up as recommended.  HPI 57 y.o. female  presents for a complete physical. She has had elevated blood pressure since 2006. Her blood pressure has been controlled at home, today their BP is BP: 130/78 She does not workout. She denies chest pain, shortness of breath, dizziness.  She continue to smoke HAS CUT BACK TO LESS THAN 1/2 pack a day.  She is not on cholesterol medication and denies myalgias. Her cholesterol is at goal. The cholesterol last visit was:   Lab Results  Component Value Date   CHOL 189 05/14/2016   HDL 71 05/14/2016   LDLCALC 96 05/14/2016   TRIG 112 05/14/2016   CHOLHDL 2.7 05/14/2016   Last A1C in the office was:  Lab Results  Component Value Date   HGBA1C  5.5 05/14/2015   Patient was on Vitamin D supplement has not been on it.  Lab Results  Component Value Date   VD25OH 38 05/14/2016   Depression in remission, on cymbalta.  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 Wants to work with animals, ideally but no experience.  BMI is Body mass index is 29.26 kg/m., she is working on diet and exercise. Wt Readings from Last 3 Encounters:  06/09/18 160 lb (72.6 kg)  01/14/18 159 lb 3.2 oz (72.2 kg)  06/02/17 161 lb 6.4 oz (73.2 kg)    Current Medications:  Current Outpatient Medications on File Prior to Visit  Medication Sig  . albuterol (PROVENTIL HFA;VENTOLIN HFA) 108 (90 Base) MCG/ACT inhaler Inhale 2 puffs into the lungs every 6 (six) hours as needed for wheezing or shortness of breath.  . ALPRAZolam (XANAX) 0.5 MG tablet Take 1/2-1 tab up to three times a day as needed for panic attacks. Limit to 5 days/week or less for potential for addiction. 30 day supply  . cetirizine (ZYRTEC) 10 MG tablet Take 10 mg by mouth daily.  . DULoxetine (CYMBALTA) 60 MG capsule TAKE 1 CAPSULE BY MOUTH EVERY DAY  . fluticasone furoate-vilanterol (BREO ELLIPTA) 100-25 MCG/INH AEPB Inhale 1 puff into the lungs daily. Rinse mouth with water after each use  . ipratropium (ATROVENT) 0.03 % nasal spray Place 2 sprays into both nostrils every 12 (twelve) hours.  Marland Kitchen olmesartan-hydrochlorothiazide (BENICAR HCT) 40-25 MG tablet TAKE HALF TO ONE TABLET BY MOUTH DAILY FOR BLOOD  PRESSURE.   No current facility-administered medications on file prior to visit.    Health Maintenance:   Immunization History  Administered Date(s) Administered  . Tdap 05/09/2013   Tetanus: 2014 Pneumovax: N/A Prevnar 13: N/A Flu vaccine: declines Zostavax: N/A  Pap: 12/2014 US pelvis 12/2014 MGM: 12/2014 normal, DUE DEXA: N/A Colonoscopy: NEEDS wants to wait until she gets insurance EGD: N/A CXR 06/2016  DEE:  May 2016 Dr. Charma Igo Patient Care Team: Unk Pinto, MD as PCP - General (Internal Medicine) Crista Luria, MD as Consulting Physician (Dermatology) Mosetta Anis, MD as Referring Physician (Allergy)   Problem List has Hypertension; Depression, major, in remission ([place]); Anxiety; Allergy; GERD (gastroesophageal reflux disease); Tobacco use disorder; and Hyperlipidemia on their problem list.  Allergies Allergies  Allergen Reactions  . Azithromycin Nausea And Vomiting  . Prednisone     Post menopausal bleeding    SURGICAL HISTORY She  has a past surgical history that includes Tonsillectomy and adenoidectomy and Cesarean section. FAMILY HISTORY Her family history includes Heart attack in her father; Hyperlipidemia in her father; Hypertension in her father. SOCIAL HISTORY She  reports that she has been smoking cigarettes. She has a 10.00 pack-year smoking history. She has never used smokeless tobacco. She reports that she drinks about 6.0 standard drinks of alcohol per week. She reports that she does not use drugs.  Review of Systems  Constitutional: Negative.   HENT: Negative.   Eyes: Negative.   Respiratory: Negative.   Cardiovascular: Negative.   Gastrointestinal: Negative.   Genitourinary: Negative.   Musculoskeletal: Negative.   Skin: Negative.   Neurological: Negative.   Endo/Heme/Allergies: Negative.   Psychiatric/Behavioral: Negative.    Physical Exam: Estimated body mass index is 29.26 kg/m as calculated from the following:   Height as of this encounter: 5\' 2"  (1.575 m).   Weight as of this encounter: 160 lb (72.6 kg). BP 130/78   Pulse (!) 101   Temp 98.6 F (37 C)   Resp 14   Ht 5\' 2"  (1.575 m)   Wt 160 lb (72.6 kg)   LMP 05/30/2013   SpO2 98%   BMI 29.26 kg/m  General Appearance: Well nourished, in no apparent distress. Eyes: PERRLA, EOMs, conjunctiva no swelling or erythema, normal fundi and vessels. Sinuses: No Frontal/maxillary  tenderness ENT/Mouth: Ext aud canals clear, normal light reflex with TMs without erythema, bulging.  Good dentition. No erythema, swelling, or exudate on post pharynx. Tonsils not swollen or erythematous. Hearing normal.  Neck: Supple, thyroid normal. No bruits Respiratory: Respiratory effort normal, BS equal bilaterally without rales, rhonchi, wheezing or stridor. Cardio: RRR without murmurs, rubs or gallops. Brisk peripheral pulses without edema.  Chest: symmetric, with normal excursions and percussion. Breasts: Symmetric, + FBD, without nipple discharge, retractions. Abdomen: Soft, +BS. Non tender, no guarding, rebound, hernias, masses, or organomegaly. .  Lymphatics: Non tender without lymphadenopathy.  Genitourinary: defer Musculoskeletal: Full ROM all peripheral extremities,5/5 strength, and normal gait. Skin: Right supraclavicular lipoma 63mm x 19mm. Warm, dry without rashes, lesions, ecchymosis.  Neuro: Cranial nerves intact, reflexes equal bilaterally. Normal muscle tone, no cerebellar symptoms. Sensation intact.  Psych: Awake and oriented X 3, normal affect, Insight and Judgment appropriate.   EKG: defer AORTA SCAN:  defer   Vicie Mutters 2:20 PM

## 2018-06-09 ENCOUNTER — Other Ambulatory Visit: Payer: Self-pay

## 2018-06-09 ENCOUNTER — Ambulatory Visit (INDEPENDENT_AMBULATORY_CARE_PROVIDER_SITE_OTHER): Payer: Self-pay | Admitting: Physician Assistant

## 2018-06-09 ENCOUNTER — Encounter: Payer: Self-pay | Admitting: Physician Assistant

## 2018-06-09 VITALS — BP 130/78 | HR 101 | Temp 98.6°F | Resp 14 | Ht 62.0 in | Wt 160.0 lb

## 2018-06-09 DIAGNOSIS — I10 Essential (primary) hypertension: Secondary | ICD-10-CM

## 2018-06-09 DIAGNOSIS — Z6829 Body mass index (BMI) 29.0-29.9, adult: Secondary | ICD-10-CM

## 2018-06-09 DIAGNOSIS — F325 Major depressive disorder, single episode, in full remission: Secondary | ICD-10-CM

## 2018-06-09 DIAGNOSIS — E785 Hyperlipidemia, unspecified: Secondary | ICD-10-CM

## 2018-06-09 DIAGNOSIS — F17200 Nicotine dependence, unspecified, uncomplicated: Secondary | ICD-10-CM

## 2018-06-09 DIAGNOSIS — F419 Anxiety disorder, unspecified: Secondary | ICD-10-CM

## 2018-06-09 DIAGNOSIS — K219 Gastro-esophageal reflux disease without esophagitis: Secondary | ICD-10-CM

## 2018-06-09 DIAGNOSIS — T7840XD Allergy, unspecified, subsequent encounter: Secondary | ICD-10-CM

## 2018-06-09 DIAGNOSIS — Z8619 Personal history of other infectious and parasitic diseases: Secondary | ICD-10-CM

## 2018-06-09 MED ORDER — ACYCLOVIR 400 MG PO TABS: ORAL_TABLET | ORAL | 2 refills | Status: DC

## 2018-06-09 MED ORDER — TRIAMCINOLONE ACETONIDE 0.5 % EX CREA: 1.0000 "application " | TOPICAL_CREAM | Freq: Two times a day (BID) | CUTANEOUS | 2 refills | Status: DC

## 2018-06-09 NOTE — Patient Instructions (Addendum)
[facility] Schedule an appointment by calling [phone]. They do 99 dollar mammograms Please check into this  We will discuss colonoscopy when you get insurance There is a cologuard though, this is 600 dollars  Here is info below  Cologuard is an easy to use noninvasive colon cancer screening test based on the latest advances in stool DNA science.   Colon cancer is 3rd most diagnosed cancer and 2nd leading cause of death in both men and women 66 years of age and older despite being one of the most preventable and treatable cancers if found early.  4 of out 5 people diagnosed with colon cancer have NO prior family history.  When caught EARLY 90% of colon cancer is curable.   You have agreed to do a Cologuard screening and have declined a colonoscopy in spite of being explained the risks and benefits of the colonoscopy in detail, including cancer and death. Please understand that this is test not as sensitive or specific as a colonoscopy and you are still recommended to get a colonoscopy.   If you are NOT medicare please call your insurance company and give them these items to see if they will cover it: 1) CPT code, 5734267323 2) Provider is Probation officer 3) [employer] NPI [id number] 4) [place] Tax ID [phone]  Out-of-pocket cost for Cologuard can range from $0 - $649 so please call  You will receive a short call from [place] support center at [employer], when you receive a call they will say they are from [place],  to confirm your mailing address and give you more information.  When they calll you, it will appear on the caller ID as "[employer]" or in some cases only this number will appear, [phone].   [employer] will ship your collection kit directly to you. You will collect a single stool sample in the privacy of your own home, no special preparation required. You will return the kit via [place]  pre-paid shipping or pick-up, in the same box it arrived in. Then I will contact you to discuss your results after I receive them from the laboratory.   If you have any questions or concerns, Cologuard Customer Support Specialist are available 24 hours a day, 7 days a week at [phone] or go to [email or web address].    American cancer society  4040335594 for more information or for a free program for smoking cessation help.   You can call QUIT SMART 1-800-QUIT-NOW for free nicotine patches or replacement therapy- if they are out- keep calling  [place] [facility] Can call for smoking cessation classes, [phone]  If you have a smart phone, please look up Smoke Free app, this will help you stay on track and give you information about money you have saved, life that you have gained back and a ton of more information.   We are giving you chantix for smoking cessation. You can do it! And we are here to help! You may have heard some scary side effects about chantix, the three most common I hear about are nausea, crazy dreams and depression.  However, I like for my patients to try to stay on 1/2 a tablet twice a day rather than one tablet twice a day as normally prescribed. This helps decrease the chances of side effects and helps save money by making a one month prescription last two months  Please start the prescription this way:  Start 1/2 tablet by mouth  once daily after food with a full glass of water for 3 days Then do 1/2 tablet by mouth twice daily for 4 days. During this first week you can smoke, but try to stop after this week.  At this point we have several options: 1) continue on 1/2 tablet twice a day- which I encourage you to do. You can stay on this dose the rest of the time on the medication or if you still feel the need to smoke you can do one of the two options below. 2) do one tablet in the morning and 1/2 in the evening which helps decrease dreams. 3) do one  tablet twice a day.   What if I miss a dose? If you miss a dose, take it as soon as you can. If it is almost time for your next dose, take only that dose. Do not take double or extra doses.  What should I watch for while using this medicine? Visit your doctor or health care professional for regular check ups. Ask for ongoing advice and encouragement from your doctor or healthcare professional, friends, and family to help you quit. If you smoke while on this medication, quit again  Your mouth may get dry. Chewing sugarless gum or hard candy, and drinking plenty of water may help. Contact your doctor if the problem does not go away or is severe.  You may get drowsy or dizzy. Do not drive, use machinery, or do anything that needs mental alertness until you know how this medicine affects you. Do not stand or sit up quickly, especially if you are an older patient.   The use of this medicine may increase the chance of suicidal thoughts or actions. Pay special attention to how you are responding while on this medicine. Any worsening of mood, or thoughts of suicide or dying should be reported to your health care professional right away.  ADVANTAGES OF QUITTING SMOKING  Within 20 minutes, blood pressure decreases. Your pulse is at normal level.  After 8 hours, carbon monoxide levels in the blood return to normal. Your oxygen level increases.  After 24 hours, the chance of having a heart attack starts to decrease. Your breath, hair, and body stop smelling like smoke.  After 48 hours, damaged nerve endings begin to recover. Your sense of taste and smell improve.  After 72 hours, the body is virtually free of nicotine. Your bronchial tubes relax and breathing becomes easier.  After 2 to 12 weeks, lungs can hold more air. Exercise becomes easier and circulation improves.  After 1 year, the risk of coronary heart disease is cut in half.  After 5 years, the risk of stroke falls to the same as a  nonsmoker.  After 10 years, the risk of lung cancer is cut in half and the risk of other cancers decreases significantly.  After 15 years, the risk of coronary heart disease drops, usually to the level of a nonsmoker.  You will have extra money to spend on things other than cigarettes.

## 2018-06-10 LAB — CBC WITH DIFFERENTIAL/PLATELET
BASOS PCT: 0.7 %
Basophils Absolute: 57 cells/uL (ref 0–200)
EOS PCT: 3.6 %
Eosinophils Absolute: 295 cells/uL (ref 15–500)
HCT: 44.9 % (ref 35.0–45.0)
Hemoglobin: 15.3 g/dL (ref 11.7–15.5)
LYMPHS ABS: 2444 {cells}/uL (ref 850–3900)
MCH: 31.9 pg (ref 27.0–33.0)
MCHC: 34.1 g/dL (ref 32.0–36.0)
MCV: 93.5 fL (ref 80.0–100.0)
MPV: 9.5 fL (ref 7.5–12.5)
Monocytes Relative: 7.8 %
NEUTROS PCT: 58.1 %
Neutro Abs: 4764 cells/uL (ref 1500–7800)
PLATELETS: 290 10*3/uL (ref 140–400)
RBC: 4.8 10*6/uL (ref 3.80–5.10)
RDW: 12.3 % (ref 11.0–15.0)
TOTAL LYMPHOCYTE: 29.8 %
WBC mixed population: 640 cells/uL (ref 200–950)
WBC: 8.2 10*3/uL (ref 3.8–10.8)

## 2018-06-10 LAB — COMPLETE METABOLIC PANEL WITH GFR
AG Ratio: 1.8 (calc) (ref 1.0–2.5)
ALT: 14 U/L (ref 6–29)
AST: 18 U/L (ref 10–35)
Albumin: 4.3 g/dL (ref 3.6–5.1)
Alkaline phosphatase (APISO): 82 U/L (ref 33–130)
BUN: 22 mg/dL (ref 7–25)
CALCIUM: 9.4 mg/dL (ref 8.6–10.4)
CO2: 32 mmol/L (ref 20–32)
CREATININE: 0.77 mg/dL (ref 0.50–1.05)
Chloride: 101 mmol/L (ref 98–110)
GFR, EST NON AFRICAN AMERICAN: 86 mL/min/{1.73_m2} (ref >=60)
GFR, Est African American: 99 mL/min/{1.73_m2} (ref >=60)
Globulin: 2.4 g/dL (calc) (ref 1.9–3.7)
Glucose, Bld: 87 mg/dL (ref 65–99)
POTASSIUM: 4.8 mmol/L (ref 3.5–5.3)
Sodium: 140 mmol/L (ref 135–146)
Total Bilirubin: 0.3 mg/dL (ref 0.2–1.2)
Total Protein: 6.7 g/dL (ref 6.1–8.1)

## 2018-06-10 LAB — LIPID PANEL
CHOL/HDL RATIO: 3.1 (calc) (ref <5.0)
Cholesterol: 223 mg/dL — ABNORMAL HIGH (ref <200)
HDL: 72 mg/dL (ref >50)
LDL Cholesterol (Calc): 121 mg/dL (calc) — ABNORMAL HIGH
NON-HDL CHOLESTEROL (CALC): 151 mg/dL — AB (ref <130)
Triglycerides: 189 mg/dL — ABNORMAL HIGH (ref <150)

## 2018-08-02 ENCOUNTER — Ambulatory Visit (INDEPENDENT_AMBULATORY_CARE_PROVIDER_SITE_OTHER): Payer: Self-pay | Admitting: Internal Medicine

## 2018-08-02 ENCOUNTER — Encounter: Payer: Self-pay | Admitting: Internal Medicine

## 2018-08-02 VITALS — BP 114/76 | HR 84 | Temp 97.1°F | Resp 16 | Ht 62.0 in | Wt 156.0 lb

## 2018-08-02 DIAGNOSIS — J0140 Acute pansinusitis, unspecified: Secondary | ICD-10-CM

## 2018-08-02 DIAGNOSIS — J0410 Acute tracheitis without obstruction: Secondary | ICD-10-CM

## 2018-08-02 MED ORDER — BENZONATATE 200 MG PO CAPS: ORAL_CAPSULE | ORAL | 1 refills | Status: DC

## 2018-08-02 MED ORDER — DEXAMETHASONE 0.5 MG PO TABS: ORAL_TABLET | ORAL | 0 refills | Status: DC

## 2018-08-02 MED ORDER — AZITHROMYCIN 250 MG PO TABS: ORAL_TABLET | ORAL | 1 refills | Status: DC

## 2018-08-02 NOTE — Progress Notes (Signed)
  Subjective:    Patient ID: DECIE VERNE, female    DOB: 07-26-1961, 57 y.o.   MRN: [id number]  HPI      This very nice 57 yo WF with HTN present with a 2-3 week hx/o sinus HA, pressure & congestion and productive cough. No fevers, chills, sweats.  Medication Sig  . acyclovir 400 MG  TAKE ONE TABLET 3 TIMES A DAY AS NEEDED.  Marland Kitchen albuterol HFA inhaler Inhale 2 puffs into the lungs every 6 (six) hours as needed for wheezing  . ALPRAZolam  0.5 MG Take 1/2-1 tab up to three times a day as needed for panic attacks.   . cetirizine ( 10 MG tablet Take 10 mg by mouth daily.  . DULoxetine  60 MG cap TAKE 1 CAPSULE BY MOUTH EVERY DAY  . olmesartan-hctz  40-25 MG  TAKE1/2-1 TABLET  DAILY FOR   . OTC Flonase nasal spray Uses  PRN.  . triamcinolone crm 0.5 % Apply 1 application topically 2  x / daily.  Marland Kitchen BREO  100-25   Inhale 1 puff into the lungs daily. Rinse mouth with water after each use  . ATROVENT 0.03 % nasal  Place 2 sprays into both nostrils every 12 (twelve) hours.   Allergies  Allergen Reactions  . Azithromycin Nausea And Vomiting  . Prednisone     Post menopausal bleeding   Past Medical History:  Diagnosis Date  . Allergy   . Anxiety   . Depression   . Hypertension 2006   Review of Systems    10 point systems review negative except as above.    Objective:   Physical Exam  BP 114/76   Pulse 84   Temp (!) 97.1 F (36.2 C)   Resp 16   Ht 5\' 2"  (1.575 m)   Wt 156 lb (70.8 kg)   LMP 05/30/2013   BMI 28.53 kg/m   Dry cough. No stridor. No rash/cyanosis.  HEENT - EAC's/TM's - Nl. N/O/P - clear . (+) tender fronto/maxillary areas.  Neck - supple. No sig LN's.  Chest - Sl hoarse raspy voice. Few scattered inspir rales and scattered rhonchi.  Few forced post tussive end expir wheezes.   Cor - Nl HS. RRR w/o sig MGR.  No edema. MS- FROM w/o deformities.  Gait Nl. Neuro -  Nl w/o focal abnormalities.    Assessment & Plan:   1. Tracheitis  - azithromycin 250 MG tablet;  Take 2 tablets  on  Day 1,  followed by 1 tablet once daily on Days 2 through 5. (Take with food to avoid Nausea)  Dispense: 6 each; Refill: 1 (Patient iswilling to retry Zpak)  - benzonatate  200 MG capsule; Take 1 perle 3 x / day to prevent cough  Dispense: 30 capsule; Refill: 1  - dexamethasone 0.5 MG tablet; Take 1 tab 3 x day - 3 days, then 2 x day - 3 days, then 1 tab daily  Dispense: 20 tablet;  2. Acute non-recurrent pansinusitis

## 2018-08-07 ENCOUNTER — Other Ambulatory Visit: Payer: Self-pay | Admitting: Physician Assistant

## 2018-08-07 DIAGNOSIS — F325 Major depressive disorder, single episode, in full remission: Secondary | ICD-10-CM

## 2018-09-02 ENCOUNTER — Other Ambulatory Visit: Payer: Self-pay | Admitting: Physician Assistant

## 2018-09-02 DIAGNOSIS — F325 Major depressive disorder, single episode, in full remission: Secondary | ICD-10-CM

## 2018-10-15 ENCOUNTER — Other Ambulatory Visit: Payer: Self-pay | Admitting: Adult Health

## 2018-10-15 DIAGNOSIS — F419 Anxiety disorder, unspecified: Secondary | ICD-10-CM

## 2018-10-18 ENCOUNTER — Other Ambulatory Visit: Payer: Self-pay | Admitting: Adult Health

## 2018-10-18 DIAGNOSIS — F419 Anxiety disorder, unspecified: Secondary | ICD-10-CM

## 2018-10-18 MED ORDER — ALPRAZOLAM 0.5 MG PO TABS: ORAL_TABLET | ORAL | 0 refills | Status: DC

## 2018-11-24 ENCOUNTER — Other Ambulatory Visit: Payer: Self-pay | Admitting: Physician Assistant

## 2018-11-24 DIAGNOSIS — F325 Major depressive disorder, single episode, in full remission: Secondary | ICD-10-CM

## 2018-11-26 ENCOUNTER — Other Ambulatory Visit: Payer: Self-pay | Admitting: Adult Health

## 2018-11-26 DIAGNOSIS — F419 Anxiety disorder, unspecified: Secondary | ICD-10-CM

## 2018-12-12 NOTE — Progress Notes (Deleted)
3 month OV She is self pay  Assessment and Plan: Essential hypertension - continue medications, DASH diet, exercise and monitor at home. Call if greater than 130/80.  - will increase benicar to 40/25 and will do 1/2  Depression, major, in remission ([place]) Depression- continue medications, stress management techniques discussed, increase water, good sleep hygiene discussed, increase exercise, and increase veggies.  - DULoxetine (CYMBALTA) 60 MG capsule; Take 1 capsule (60 mg total) by mouth daily.  Dispense: 30 capsule; Refill: 5  Tobacco use disorder Advised to stop smoking,  not ready to quit at this time but she is cutting back   Hyperlipidemia -decrease fatty foods, increase activity.   Vitamin D deficiency   Medication management  Discussed med's effects and SE's. Screening labs and tests as requested with regular follow-up as recommended. Future Appointments  Date Time Provider [place]  12/13/2018  2:30 PM Vicie Mutters, PA-C [facility] None  06/14/2019  2:00 PM Vicie Mutters, PA-C [facility] None    HPI 58 y.o. female  presents for a follow up.  She has had elevated blood pressure since 2006. Her blood pressure has been controlled at home, today their BP is   She does not workout. She denies chest pain, shortness of breath, dizziness.  She continue to smoke  1/2 pack a day.  She is not on cholesterol medication and denies myalgias. Her cholesterol is at goal. The cholesterol last visit was:   Lab Results  Component Value Date   CHOL 223 (H) 06/09/2018   HDL 72 06/09/2018   LDLCALC 121 (H) 06/09/2018   TRIG 189 (H) 06/09/2018   CHOLHDL 3.1 06/09/2018   Last A1C in the office was:  Lab Results  Component Value Date   HGBA1C 5.5 05/14/2015   Patient was on Vitamin D supplement has not been on it.  Lab Results  Component Value Date   VD25OH 38 05/14/2016   Depression in remission, on cymbalta. Will take xanax 1-2 x a day. 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  BMI is There is no height or weight on file to calculate BMI., she is working on diet and exercise. Wt Readings from Last 3 Encounters:  08/02/18 156 lb (70.8 kg)  06/09/18 160 lb (72.6 kg)  01/14/18 159 lb 3.2 oz (72.2 kg)    Current Medications:  Current Outpatient Medications on File Prior to Visit  Medication Sig Dispense Refill  . acyclovir (ZOVIRAX) 400 MG tablet TAKE ONE TABLET 3 TIMES A DAY AS NEEDED. 30 tablet 2  . albuterol (PROVENTIL HFA;VENTOLIN HFA) 108 (90 Base) MCG/ACT inhaler Inhale 2 puffs into the lungs every 6 (six) hours as needed for wheezing or shortness of breath. 1 Inhaler 2  . ALPRAZolam (XANAX) 0.5 MG tablet TAKE 0.5-1 TAB 3XDAY AS NEEDED FOR SEVERE ANXIETY LIMIT TO <5 DAYS/WEEK TO AVOID ADDICTION POTENTIAL 75 tablet 0  . azithromycin (ZITHROMAX) 250 MG tablet Take 2 tablets  on  Day 1,  followed by 1 tablet once daily on Days 2 through 5. (Take with food to avoid Nausea) 6 each 1  . benzonatate (TESSALON) 200 MG capsule Take 1 perle 3 x / day to prevent cough 30 capsule 1  . cetirizine (ZYRTEC) 10 MG tablet Take 10 mg by mouth daily.    . CYMBALTA 60 MG capsule TAKE 1 CAPSULE BY MOUTH EVERY DAY 90 capsule 2  . dexamethasone (DECADRON)  0.5 MG tablet Take 1 tab 3 x day - 3 days, then 2 x day - 3 days, then 1 tab daily 20 tablet 0  . olmesartan-hydrochlorothiazide (BENICAR HCT) 40-25 MG tablet TAKE HALF TO ONE TABLET BY MOUTH DAILY FOR BLOOD PRESSURE. 30 tablet 3  . OVER THE COUNTER MEDICATION Uses OTC Flonase nasal spray PRN.    . triamcinolone cream (KENALOG) 0.5 % Apply 1 application topically 2 (two) times daily. 80 g 2   No current facility-administered medications on file prior to visit.    Problem List has Hypertension; Depression, major, in remission ([place]); Anxiety; Allergy; GERD (gastroesophageal reflux disease); Tobacco use disorder; and Hyperlipidemia on their problem  list.  Allergies Allergies  Allergen Reactions  . Azithromycin Nausea And Vomiting  . Prednisone     Post menopausal bleeding   Surgical History: reviewed and unchanged Family History: reviewed and unchanged Social History: reviewed and unchanged  Review of Systems  Constitutional: Negative.   HENT: Negative.   Eyes: Negative.   Respiratory: Negative.   Cardiovascular: Negative.   Gastrointestinal: Negative.   Genitourinary: Negative.   Musculoskeletal: Negative.   Skin: Negative.   Neurological: Negative.   Endo/Heme/Allergies: Negative.   Psychiatric/Behavioral: Negative.    Physical Exam: Estimated body mass index is 28.53 kg/m as calculated from the following:   Height as of 08/02/18: 5\' 2"  (1.575 m).   Weight as of 08/02/18: 156 lb (70.8 kg). LMP 05/30/2013  General Appearance: Well nourished, in no apparent distress. Eyes: PERRLA, EOMs, conjunctiva no swelling or erythema, normal fundi and vessels. Sinuses: No Frontal/maxillary tenderness ENT/Mouth: Ext aud canals clear, normal light reflex with TMs without erythema, bulging.  Good dentition. No erythema, swelling, or exudate on post pharynx. Tonsils not swollen or erythematous. Hearing normal.  Neck: Supple, thyroid normal. No bruits Respiratory: Respiratory effort normal, BS equal bilaterally without rales, rhonchi, wheezing or stridor. Cardio: RRR without murmurs, rubs or gallops. Brisk peripheral pulses without edema.  Chest: symmetric, with normal excursions and percussion. Breasts: Symmetric, + FBD, without nipple discharge, retractions. Abdomen: Soft, +BS. Non tender, no guarding, rebound, hernias, masses, or organomegaly. .  Lymphatics: Non tender without lymphadenopathy.  Genitourinary: defer Musculoskeletal: Full ROM all peripheral extremities,5/5 strength, and normal gait. Skin: Right supraclavicular lipoma 84mm x 42mm. Warm, dry without rashes, lesions, ecchymosis.  Neuro: Cranial nerves intact, reflexes  equal bilaterally. Normal muscle tone, no cerebellar symptoms. Sensation intact.  Psych: Awake and oriented X 3, normal affect, Insight and Judgment appropriate.    Vicie Mutters 8:04 PM

## 2018-12-13 ENCOUNTER — Ambulatory Visit: Payer: Self-pay | Admitting: Physician Assistant

## 2019-04-20 ENCOUNTER — Other Ambulatory Visit: Payer: Self-pay | Admitting: Physician Assistant

## 2019-04-20 DIAGNOSIS — F325 Major depressive disorder, single episode, in full remission: Secondary | ICD-10-CM

## 2019-05-03 ENCOUNTER — Other Ambulatory Visit: Payer: Self-pay | Admitting: Physician Assistant

## 2019-05-03 DIAGNOSIS — F419 Anxiety disorder, unspecified: Secondary | ICD-10-CM

## 2019-06-03 ENCOUNTER — Other Ambulatory Visit: Payer: Self-pay | Admitting: Adult Health

## 2019-06-03 DIAGNOSIS — F419 Anxiety disorder, unspecified: Secondary | ICD-10-CM

## 2019-06-13 NOTE — Progress Notes (Signed)
CPE Assessment and Plan: Essential hypertension - continue medications, DASH diet, exercise and monitor at home. Call if greater than 130/80.  - will increase benicar to 40/25 and will do 1/2  Depression, major, in remission ([place]) Depression- continue medications, stress management techniques discussed, increase water, good sleep hygiene discussed, increase exercise, and increase veggies.  - DULoxetine (CYMBALTA) 60 MG capsule; Take 1 capsule (60 mg total) by mouth daily.  Dispense: 30 capsule; Refill: 5  Tobacco use disorder Advised to stop smoking,  not ready to quit at this time but she is cutting back  Anxiety Continue xanax PRN, discussed addictive nature and that patient should limit use.    Gastroesophageal reflux disease, esophagitis presence not specified Continue PPI/H2 blocker, diet discussed  Allergy, subsequent encounter Continue OTC meds   Hyperlipidemia -decrease fatty foods, increase activity - check labs.   Vitamin D deficiency   Medication management  Encounter for general adult medical examination with abnormal findings Needs COLONOSCOPY- wants to wait until insurance NEEDS MGM- [facility]  Discussed med's effects and SE's. Screening labs and tests as requested with regular follow-up as recommended.  HPI 58 y.o. female  presents for a complete physical, SHE IS SELF PAY.  Laid off 2018, states stress is less. Depression in remission, on cymbalta AND xanax as needed 0.5mg , last filled 05/04/2019, will take 1 at night for sleep.   She has had elevated blood pressure since 2006. Her blood pressure has been controlled at home, SHE STOPPED HER BP MEDS today their BP is BP: 128/80   BMI is Body mass index is 27.62 kg/m., she is working on diet and exercise. Wt Readings from Last 3 Encounters:  06/14/19 151 lb (68.5 kg)  08/02/18 156 lb (70.8 kg)  06/09/18 160 lb (72.6 kg)   She does not workout. She denies chest pain, shortness of  breath, dizziness.  She continue to smoke HAS CUT BACK TO LESS THAN 1/2 pack a day.  She is not on cholesterol medication and denies myalgias. Her cholesterol is at goal. The cholesterol last visit was:   Lab Results  Component Value Date   CHOL 223 (H) 06/09/2018   HDL 72 06/09/2018   LDLCALC 121 (H) 06/09/2018   TRIG 189 (H) 06/09/2018   CHOLHDL 3.1 06/09/2018   Last A1C in the office was:  Lab Results  Component Value Date   HGBA1C 5.5 05/14/2015   Patient was on Vitamin D supplement has not been on it.  Lab Results  Component Value Date   VD25OH 38 05/14/2016    Current Medications:  Current Outpatient Medications on File Prior to Visit  Medication Sig  . acyclovir (ZOVIRAX) 400 MG tablet TAKE ONE TABLET 3 TIMES A DAY AS NEEDED.  Marland Kitchen albuterol (PROVENTIL HFA;VENTOLIN HFA) 108 (90 Base) MCG/ACT inhaler Inhale 2 puffs into the lungs every 6 (six) hours as needed for wheezing or shortness of breath.  . ALPRAZolam (XANAX) 0.5 MG tablet Take 1/2-1 tablet 2 - 3 x /day ONLY if needed for Anxiety Attack &  limit to 5 days /week to avoid addiction  . cetirizine (ZYRTEC) 10 MG tablet Take 10 mg by mouth daily.  . DULoxetine (CYMBALTA) 60 MG capsule TAKE 1 CAPSULE BY MOUTH EVERY DAY  . OVER THE COUNTER MEDICATION Uses OTC Flonase nasal spray PRN.  . triamcinolone cream (KENALOG) 0.5 % Apply 1 application topically 2 (two) times daily.   No current facility-administered medications on file prior to visit.  Health Maintenance:   Immunization History  Administered Date(s) Administered  . Tdap 05/09/2013   Tetanus: 2014 Pneumovax: N/A Prevnar 13: N/A Flu vaccine: declines Zostavax: N/A  Pap: 12/2014 US pelvis 12/2014 MGM: 12/2014 normal, DUE DEXA: N/A Colonoscopy: NEEDS wants to wait until she gets insurance EGD: N/A CXR 06/2016  DEE: May 2016 Dr. Charma Igo Patient Care Team: Unk Pinto, MD as PCP - General (Internal Medicine) Crista Luria, MD as Consulting  Physician (Dermatology) Mosetta Anis, MD as Referring Physician (Allergy)   Problem List has Hypertension; Depression, major, in remission ([place]); Anxiety; Allergy; GERD (gastroesophageal reflux disease); Tobacco use disorder; and Hyperlipidemia on their problem list.  Allergies Allergies  Allergen Reactions  . Azithromycin Nausea And Vomiting  . Prednisone     Post menopausal bleeding    SURGICAL HISTORY She  has a past surgical history that includes Tonsillectomy and adenoidectomy and Cesarean section. FAMILY HISTORY Her family history includes Heart attack in her father; Hyperlipidemia in her father; Hypertension in her father. SOCIAL HISTORY She  reports that she has been smoking cigarettes. She has a 10.00 pack-year smoking history. She has never used smokeless tobacco. She reports current alcohol use of about 6.0 standard drinks of alcohol per week. She reports that she does not use drugs.  Review of Systems  Constitutional: Negative.   HENT: Negative.   Eyes: Negative.   Respiratory: Negative.   Cardiovascular: Negative.   Gastrointestinal: Negative.   Genitourinary: Negative.   Musculoskeletal: Negative.   Skin: Negative.   Neurological: Negative.   Endo/Heme/Allergies: Negative.   Psychiatric/Behavioral: Negative.    Physical Exam: Estimated body mass index is 27.62 kg/m as calculated from the following:   Height as of this encounter: 5\' 2"  (1.575 m).   Weight as of this encounter: 151 lb (68.5 kg). BP 128/80   Pulse 93   Temp 98.8 F (37.1 C)   Ht 5\' 2"  (1.575 m)   Wt 151 lb (68.5 kg)   LMP 05/30/2013   SpO2 99%   BMI 27.62 kg/m  General Appearance: Well nourished, in no apparent distress. Eyes: PERRLA, EOMs, conjunctiva no swelling or erythema, normal fundi and vessels. Sinuses: No Frontal/maxillary tenderness ENT/Mouth: Ext aud canals clear, normal light reflex with TMs without erythema, bulging.  Good dentition. No erythema, swelling, or exudate on  post pharynx. Tonsils not swollen or erythematous. Hearing normal.  Neck: Supple, thyroid normal. No bruits Respiratory: Respiratory effort normal, BS equal bilaterally without rales, rhonchi, wheezing or stridor. Cardio: RRR without murmurs, rubs or gallops. Brisk peripheral pulses without edema.  Chest: symmetric, with normal excursions and percussion. Breasts: Symmetric, + FBD, without nipple discharge, retractions. Abdomen: Soft, +BS. Non tender, no guarding, rebound, hernias, masses, or organomegaly. .  Lymphatics: Non tender without lymphadenopathy.  Genitourinary: defer Musculoskeletal: Full ROM all peripheral extremities,5/5 strength, and normal gait. Skin: Right supraclavicular lipoma 53mm x 24mm. Warm, dry without rashes, lesions, ecchymosis.  Neuro: Cranial nerves intact, reflexes equal bilaterally. Normal muscle tone, no cerebellar symptoms. Sensation intact.  Psych: Awake and oriented X 3, normal affect, Insight and Judgment appropriate.   EKG: defer AORTA SCAN:  defer   Vicie Mutters 2:08 PM

## 2019-06-14 ENCOUNTER — Other Ambulatory Visit: Payer: Self-pay

## 2019-06-14 ENCOUNTER — Encounter: Payer: Self-pay | Admitting: Physician Assistant

## 2019-06-14 ENCOUNTER — Ambulatory Visit (INDEPENDENT_AMBULATORY_CARE_PROVIDER_SITE_OTHER): Payer: Self-pay | Admitting: Physician Assistant

## 2019-06-14 VITALS — BP 128/80 | HR 93 | Temp 98.8°F | Ht 62.0 in | Wt 151.0 lb

## 2019-06-14 DIAGNOSIS — E785 Hyperlipidemia, unspecified: Secondary | ICD-10-CM

## 2019-06-14 DIAGNOSIS — Z0001 Encounter for general adult medical examination with abnormal findings: Secondary | ICD-10-CM

## 2019-06-14 DIAGNOSIS — I10 Essential (primary) hypertension: Secondary | ICD-10-CM

## 2019-06-14 DIAGNOSIS — F17200 Nicotine dependence, unspecified, uncomplicated: Secondary | ICD-10-CM

## 2019-06-14 DIAGNOSIS — E559 Vitamin D deficiency, unspecified: Secondary | ICD-10-CM

## 2019-06-14 DIAGNOSIS — Z79899 Other long term (current) drug therapy: Secondary | ICD-10-CM

## 2019-06-14 DIAGNOSIS — F325 Major depressive disorder, single episode, in full remission: Secondary | ICD-10-CM

## 2019-06-14 DIAGNOSIS — T7840XD Allergy, unspecified, subsequent encounter: Secondary | ICD-10-CM

## 2019-06-14 DIAGNOSIS — K219 Gastro-esophageal reflux disease without esophagitis: Secondary | ICD-10-CM

## 2019-06-14 DIAGNOSIS — F419 Anxiety disorder, unspecified: Secondary | ICD-10-CM

## 2019-06-14 MED ORDER — DULOXETINE HCL 60 MG PO CPEP: ORAL_CAPSULE | ORAL | 1 refills | Status: DC

## 2019-06-14 MED ORDER — ALPRAZOLAM 0.5 MG PO TABS: ORAL_TABLET | ORAL | 0 refills | Status: DC

## 2019-06-14 MED ORDER — TERBINAFINE HCL 250 MG PO TABS: 250.0000 mg | ORAL_TABLET | Freq: Every day | ORAL | 0 refills | Status: AC

## 2019-06-14 NOTE — Patient Instructions (Addendum)
NEW GUIDELINES FOR BENOZOS  New guidelines suggest the benzodiazepines are best short term, with prolonged use they lead to physical and psychological dependence. In addition, evidence suggest that for insomnia the effectiveness wanes in 4 weeks and the risks out weight their benefits. Use of these agents have been associated with dementia, falls, motor vehicle accidents and physical addiction. Decreasing these medication have been proven to show improvements in cognition, alertness, decrease of falls and daytime sedation.   Okay I will send in lamisil for you to take. You can only get it from [facility] or [employer] will not cover it.The lamisil is taken once a day for  months and then if can be taken for several months after for a month on it and month off it. It gets processed through you liver so we need to check your liver function at 6 weeks after taking the drug. It can take up to 6 months to a year for your toenails to get better.   VITAMIN D IS IMPORTANT  Vitamin D goal is between 60-80  Please make sure that you are taking your Vitamin D as directed.   It is very important as a natural anti-inflammatory   helping hair, skin, and nails, as well as reducing stroke and heart attack risk.   It helps your bones and helps with mood.  We want you on at least 11-4998 IU daily Or can get 50,000 iu from [facility] and take once a week  It also decreases numerous cancer risks so please take it as directed.   Low Vit D is associated with a 200-300% higher risk for CANCER   and 200-300% higher risk for HEART   ATTACK  &  STROKE.    .....................................Marland Kitchen  It is also associated with higher death rate at younger ages,   autoimmune diseases like Rheumatoid arthritis, Lupus, Multiple Sclerosis.     Also many other serious conditions, like depression, Alzheimer's  Dementia, infertility, muscle aches, fatigue, fibromyalgia - just to name a  few.  +++++++++++++++++++  Can get liquid vitamin D from [place] here in [place] at  [facility] alternatives [address], [place], [place] [address] Or you can try earth fare   HOW TO SCHEDULE A MAMMOGRAM- can gt for 99$ at the breast center  [facility]  7 a.m.-6:30 p.m., Monday 7 a.m.-5 p.m., Tuesday-Friday Schedule an appointment by calling [phone].   HYPERTENSION INFORMATION  Monitor your blood pressure at home, please keep a record and bring that in with you to your next office visit.   Go to the ER if any CP, SOB, nausea, dizziness, severe HA, changes vision/speech  Testing/Procedures: HOW TO TAKE YOUR BLOOD PRESSURE:  Rest 5 minutes before taking your blood pressure.  Don't smoke or drink caffeinated beverages for at least 30 minutes before.  Take your blood pressure before (not after) you eat.  Sit comfortably with your back supported and both feet on the floor (don't cross your legs).  Elevate your arm to heart level on a table or a desk.  Use the proper sized cuff. It should fit smoothly and snugly around your bare upper arm. There should be enough room to slip a fingertip under the cuff. The bottom edge of the cuff should be 1 inch above the crease of the elbow.  Due to a recent study, SPRINT, we have changed our goal for the systolic or top blood pressure number. Ideally we want your top  number at 120.  In the [facility] Trial, 5000 people were randomized to a goal BP of 120 and 5000 people were randomized to a goal BP of less than 140. The patients with the goal BP at 120 had LESS DEMENTIA, LESS HEART ATTACKS, AND LESS STROKES, AS WELL AS OVERALL DECREASED MORTALITY OR DEATH RATE.   There was another study that showed taking your blood pressure medications at night decrease cardiovascular events.  However if you are on a fluid pill, please take this in the morning.   If you are willing, our goal BP is the top number of 120.   Your most recent BP: BP: 128/80   Take your medications faithfully as instructed. Maintain a healthy weight. Get at least 150 minutes of aerobic exercise per week. Minimize salt intake. Minimize alcohol intake  DASH Eating Plan DASH stands for "Dietary Approaches to Stop Hypertension." The DASH eating plan is a healthy eating plan that has been shown to reduce high blood pressure (hypertension). Additional health benefits may include reducing the risk of type 2 diabetes mellitus, heart disease, and stroke. The DASH eating plan may also help with weight loss. WHAT DO I NEED TO KNOW ABOUT THE DASH EATING PLAN? For the DASH eating plan, you will follow these general guidelines:  Choose foods with a percent daily value for sodium of less than 5% (as listed on the food label).  Use salt-free seasonings or herbs instead of table salt or sea salt.  Check with your health care provider or pharmacist before using salt substitutes.  Eat lower-sodium products, often labeled as "lower sodium" or "no salt added."  Eat fresh foods.  Eat more vegetables, fruits, and low-fat dairy products.  Choose whole grains. Look for the word "whole" as the first word in the ingredient list.  Choose fish and skinless chicken or [place] more often than red meat. Limit fish, poultry, and meat to 6 oz (170 g) each day.  Limit sweets, desserts, sugars, and sugary drinks.  Choose heart-healthy fats.  Limit cheese to 1 oz (28 g) per day.  Eat more home-cooked food and less restaurant, buffet, and fast food.  Limit fried foods.  Cook foods using methods other than frying.  Limit canned vegetables. If you do use them, rinse them well to decrease the sodium.  When eating at a restaurant, ask that your food be prepared with less salt, or no salt if possible. WHAT FOODS CAN I EAT? Seek help from a dietitian for individual calorie needs. Grains Whole grain or whole wheat bread. Brown rice. Whole grain or  whole wheat pasta. Quinoa, bulgur, and whole grain cereals. Low-sodium cereals. Corn or whole wheat flour tortillas. Whole grain cornbread. Whole grain crackers. Low-sodium crackers. Vegetables Fresh or frozen vegetables (raw, steamed, roasted, or grilled). Low-sodium or reduced-sodium tomato and vegetable juices. Low-sodium or reduced-sodium tomato sauce and paste. Low-sodium or reduced-sodium canned vegetables.  Fruits All fresh, canned (in natural juice), or frozen fruits. Meat and Other Protein Products Ground beef (85% or leaner), grass-fed beef, or beef trimmed of fat. Skinless chicken or [place]. Ground chicken or [place]. Pork trimmed of fat. All fish and seafood. Eggs. Dried beans, peas, or lentils. Unsalted nuts and seeds. Unsalted canned beans. Dairy Low-fat dairy products, such as skim or 1% milk, 2% or reduced-fat cheeses, low-fat ricotta or cottage cheese, or plain low-fat yogurt. Low-sodium or reduced-sodium cheeses. Fats and Oils Tub margarines without trans fats. Light or reduced-fat mayonnaise and salad dressings (reduced sodium). Avocado.  Safflower, olive, or canola oils. Natural peanut or almond butter. Other Unsalted popcorn and pretzels. The items listed above may not be a complete list of recommended foods or beverages. Contact your dietitian for more options. WHAT FOODS ARE NOT RECOMMENDED? Grains White bread. White pasta. White rice. Refined cornbread. Bagels and croissants. Crackers that contain trans fat. Vegetables Creamed or fried vegetables. Vegetables in a cheese sauce. Regular canned vegetables. Regular canned tomato sauce and paste. Regular tomato and vegetable juices. Fruits Dried fruits. Canned fruit in light or heavy syrup. Fruit juice. Meat and Other Protein Products Fatty cuts of meat. Ribs, chicken wings, bacon, sausage, bologna, salami, chitterlings, fatback, hot dogs, bratwurst, and packaged luncheon meats. Salted nuts and seeds. Canned beans with  salt. Dairy Whole or 2% milk, cream, half-and-half, and cream cheese. Whole-fat or sweetened yogurt. Full-fat cheeses or blue cheese. Nondairy creamers and whipped toppings. Processed cheese, cheese spreads, or cheese curds. Condiments Onion and garlic salt, seasoned salt, table salt, and sea salt. Canned and packaged gravies. Worcestershire sauce. Tartar sauce. Barbecue sauce. Teriyaki sauce. Soy sauce, including reduced sodium. Steak sauce. Fish sauce. Oyster sauce. Cocktail sauce. Horseradish. Ketchup and mustard. Meat flavorings and tenderizers. Bouillon cubes. Hot sauce. Tabasco sauce. Marinades. Taco seasonings. Relishes. Fats and Oils Butter, stick margarine, lard, shortening, ghee, and bacon fat. Coconut, palm kernel, or palm oils. Regular salad dressings. Other Pickles and olives. Salted popcorn and pretzels. The items listed above may not be a complete list of foods and beverages to avoid. Contact your dietitian for more information. WHERE CAN I FIND MORE INFORMATION? National Heart, Lung, and Blood Institute: travelstabloid.com Document Released: 10/02/2011 Document Revised: 02/27/2014 Document Reviewed: 08/17/2013 [facility] Patient Information 2015 [place], [place]. This information is not intended to replace advice given to you by your health care provider. Make sure you discuss any questions you have with your health care provider.      SMOKING CESSATION  American cancer society  ([phone] 185-1455 for more information or for a free program for smoking cessation help.   You can call QUIT SMART 1-800-QUIT-NOW for free nicotine patches or replacement therapy- if they are out- keep calling  [place] [facility] Can call for smoking cessation classes, [phone]  If you have a smart phone, please look up Smoke Free app, this will help you stay on track and give you information about money you have saved, life that you have gained back and a ton  of more information.     ADVANTAGES OF QUITTING SMOKING  Within 20 minutes, blood pressure decreases. Your pulse is at normal level.  After 8 hours, carbon monoxide levels in the blood return to normal. Your oxygen level increases.  After 24 hours, the chance of having a heart attack starts to decrease. Your breath, hair, and body stop smelling like smoke.  After 48 hours, damaged nerve endings begin to recover. Your sense of taste and smell improve.  After 72 hours, the body is virtually free of nicotine. Your bronchial tubes relax and breathing becomes easier.  After 2 to 12 weeks, lungs can hold more air. Exercise becomes easier and circulation improves.  After 1 year, the risk of coronary heart disease is cut in half.  After 5 years, the risk of stroke falls to the same as a nonsmoker.  After 10 years, the risk of lung cancer is cut in half and the risk of other cancers decreases significantly.  After 15 years, the risk of coronary heart disease drops, usually to the  level of a nonsmoker.  You will have extra money to spend on things other than cigarettes.

## 2019-06-15 LAB — LIPID PANEL
Cholesterol: 186 mg/dL (ref <200)
HDL: 76 mg/dL (ref >=50)
LDL Cholesterol (Calc): 92 mg/dL (calc)
Non-HDL Cholesterol (Calc): 110 mg/dL (calc) (ref <130)
Total CHOL/HDL Ratio: 2.4 (calc) (ref <5.0)
Triglycerides: 90 mg/dL (ref <150)

## 2019-06-15 LAB — CBC WITH DIFFERENTIAL/PLATELET
Absolute Monocytes: 636 cells/uL (ref 200–950)
Basophils Absolute: 37 cells/uL (ref 0–200)
Basophils Relative: 0.5 %
Eosinophils Absolute: 52 cells/uL (ref 15–500)
Eosinophils Relative: 0.7 %
HCT: 43.7 % (ref 35.0–45.0)
Hemoglobin: 15.2 g/dL (ref 11.7–15.5)
Lymphs Abs: 2131 cells/uL (ref 850–3900)
MCH: 32.2 pg (ref 27.0–33.0)
MCHC: 34.8 g/dL (ref 32.0–36.0)
MCV: 92.6 fL (ref 80.0–100.0)
MPV: 9.7 fL (ref 7.5–12.5)
Monocytes Relative: 8.6 %
Neutro Abs: 4544 cells/uL (ref 1500–7800)
Neutrophils Relative %: 61.4 %
Platelets: 249 10*3/uL (ref 140–400)
RBC: 4.72 10*6/uL (ref 3.80–5.10)
RDW: 13.1 % (ref 11.0–15.0)
Total Lymphocyte: 28.8 %
WBC: 7.4 10*3/uL (ref 3.8–10.8)

## 2019-06-15 LAB — COMPLETE METABOLIC PANEL WITH GFR
AG Ratio: 1.6 (calc) (ref 1.0–2.5)
ALT: 11 U/L (ref 6–29)
AST: 17 U/L (ref 10–35)
Albumin: 4.2 g/dL (ref 3.6–5.1)
Alkaline phosphatase (APISO): 69 U/L (ref 37–153)
BUN: 12 mg/dL (ref 7–25)
CO2: 24 mmol/L (ref 20–32)
Calcium: 9.5 mg/dL (ref 8.6–10.4)
Chloride: 106 mmol/L (ref 98–110)
Creat: 0.68 mg/dL (ref 0.50–1.05)
GFR, Est African American: 112 mL/min/{1.73_m2} (ref >=60)
GFR, Est Non African American: 96 mL/min/{1.73_m2} (ref >=60)
Globulin: 2.6 g/dL (calc) (ref 1.9–3.7)
Glucose, Bld: 89 mg/dL (ref 65–99)
Potassium: 3.7 mmol/L (ref 3.5–5.3)
Sodium: 140 mmol/L (ref 135–146)
Total Bilirubin: 0.6 mg/dL (ref 0.2–1.2)
Total Protein: 6.8 g/dL (ref 6.1–8.1)

## 2019-07-25 ENCOUNTER — Other Ambulatory Visit: Payer: Self-pay | Admitting: Physician Assistant

## 2019-07-25 DIAGNOSIS — F419 Anxiety disorder, unspecified: Secondary | ICD-10-CM

## 2019-07-26 ENCOUNTER — Other Ambulatory Visit: Payer: Self-pay

## 2019-08-23 ENCOUNTER — Other Ambulatory Visit: Payer: Self-pay | Admitting: Physician Assistant

## 2019-08-23 DIAGNOSIS — F419 Anxiety disorder, unspecified: Secondary | ICD-10-CM

## 2019-10-03 ENCOUNTER — Other Ambulatory Visit: Payer: Self-pay | Admitting: Physician Assistant

## 2019-10-03 DIAGNOSIS — F419 Anxiety disorder, unspecified: Secondary | ICD-10-CM

## 2019-11-07 ENCOUNTER — Other Ambulatory Visit: Payer: Self-pay | Admitting: Physician Assistant

## 2019-11-07 DIAGNOSIS — F419 Anxiety disorder, unspecified: Secondary | ICD-10-CM

## 2019-12-19 ENCOUNTER — Ambulatory Visit: Payer: Self-pay | Admitting: Physician Assistant

## 2019-12-19 ENCOUNTER — Other Ambulatory Visit: Payer: Self-pay | Admitting: Physician Assistant

## 2019-12-19 DIAGNOSIS — F419 Anxiety disorder, unspecified: Secondary | ICD-10-CM

## 2019-12-19 NOTE — Progress Notes (Signed)
FOLLOW UP Assessment and Plan: Essential hypertension - continue medications, DASH diet, exercise and monitor at home. Call if greater than 130/80.   Depression, major, in remission ([place]) Depression-  Try light therapy, get on vitamin d continue medications, stress management techniques discussed, increase water, good sleep hygiene discussed, increase exercise, and increase veggies.  - DULoxetine (CYMBALTA) 60 MG capsule; Take 1 capsule (60 mg total) by mouth daily.  Dispense: 30 capsule; Refill: 5  Tobacco use disorder Advised to stop smoking,  not ready to quit at this time but she is cutting back   Hyperlipidemia -decrease fatty foods, increase activity - check labs- next OV declines this visit   Vitamin D deficiency Start on meds- check next OV  Discussed med's effects and SE's. Screening labs and tests as requested with regular follow-up as recommended.  HPI 59 y.o. female  presents for follow up, SHE IS SELF PAY.  Laid off 2018, states stress is less. Depression in remission, on cymbalta AND xanax as needed 0.5mg , last filled 11/08/19, will take 1 at night for sleep.   She has had elevated blood pressure since 2006. Her blood pressure has been controlled at home, SHE STOPPED HER BP MEDS today their BP is BP: 120/68   BMI is Body mass index is 27.36 kg/m., she is working on diet and exercise. Wt Readings from Last 3 Encounters:  12/21/19 149 lb 9.6 oz (67.9 kg)  06/14/19 151 lb (68.5 kg)  08/02/18 156 lb (70.8 kg)   She does not workout. She denies chest pain, shortness of breath, dizziness.  She continues to smoke HAS CUT BACK TO LESS THAN 1/2 pack a day.  She is not on cholesterol medication and denies myalgias. Her cholesterol is at goal. The cholesterol last visit was:   Lab Results  Component Value Date   CHOL 186 06/14/2019   HDL 76 06/14/2019   LDLCALC 92 06/14/2019   TRIG 90 06/14/2019   CHOLHDL 2.4 06/14/2019   Last A1C in the office was:  Lab Results   Component Value Date   HGBA1C 5.5 05/14/2015   Patient was on Vitamin D supplement has not been on it.  Lab Results  Component Value Date   VD25OH 38 05/14/2016    Current Medications:     Current Outpatient Medications (Respiratory):  .  albuterol (PROVENTIL HFA;VENTOLIN HFA) 108 (90 Base) MCG/ACT inhaler, Inhale 2 puffs into the lungs every 6 (six) hours as needed for wheezing or shortness of breath. .  cetirizine (ZYRTEC) 10 MG tablet, Take 10 mg by mouth daily.    Current Outpatient Medications (Other):  Marland Kitchen  ALPRAZolam (XANAX) 0.5 MG tablet, TAKE 0.5-1TAB BY MOUTH TWO TIMES A DAY AS NEEDED FOR ANXIETY MUST LAST LONGER THAN ONE MONTH .  DULoxetine (CYMBALTA) 60 MG capsule, TAKE 1 CAPSULE BY MOUTH EVERY DAY .  OVER THE COUNTER MEDICATION, Uses OTC Flonase nasal spray PRN. .  triamcinolone cream (KENALOG) 0.5 %, Apply 1 application topically 2 (two) times daily. Marland Kitchen  acyclovir (ZOVIRAX) 400 MG tablet, TAKE ONE TABLET 3 TIMES A DAY AS NEEDED.  Problem List has Hypertension; Depression, major, in remission ([place]); Anxiety; Allergy; GERD (gastroesophageal reflux disease); Tobacco use disorder; and Hyperlipidemia on their problem list.  Allergies Allergies  Allergen Reactions  . Azithromycin Nausea And Vomiting  . Prednisone     Post menopausal bleeding   Surgical History: reviewed and unchanged Family History: reviewed and unchanged Social History: reviewed and unchanged   Review of Systems  Constitutional: Negative.   HENT: Negative.   Eyes: Negative.   Respiratory: Negative.   Cardiovascular: Negative.   Gastrointestinal: Negative.   Genitourinary: Negative.   Musculoskeletal: Negative.   Skin: Negative.   Neurological: Negative.   Endo/Heme/Allergies: Negative.   Psychiatric/Behavioral: Negative.    Physical Exam: Estimated body mass index is 27.36 kg/m as calculated from the following:   Height as of 06/14/19: 5\' 2"  (1.575 m).   Weight as of this encounter:  149 lb 9.6 oz (67.9 kg). BP 120/68   Pulse 77   Temp (!) 97.5 F (36.4 C)   Wt 149 lb 9.6 oz (67.9 kg)   LMP 05/30/2013   SpO2 98%   BMI 27.36 kg/m  General Appearance: Well nourished, in no apparent distress. Eyes: PERRLA, EOMs, conjunctiva no swelling or erythema, normal fundi and vessels. Sinuses: No Frontal/maxillary tenderness ENT/Mouth: Ext aud canals clear, normal light reflex with TMs without erythema, bulging.  Good dentition. No erythema, swelling, or exudate on post pharynx. Tonsils not swollen or erythematous. Hearing normal.  Neck: Supple, thyroid normal. No bruits Respiratory: Respiratory effort normal, BS equal bilaterally without rales, rhonchi, wheezing or stridor. Cardio: RRR without murmurs, rubs or gallops. Brisk peripheral pulses without edema.  Chest: symmetric, with normal excursions and percussion. Abdomen: Soft, +BS. Non tender, no guarding, rebound, hernias, masses, or organomegaly. .  Lymphatics: Non tender without lymphadenopathy.  Musculoskeletal: Full ROM all peripheral extremities,5/5 strength, and normal gait. Skin: Right supraclavicular lipoma 80mm x 48mm. Warm, dry without rashes, lesions, ecchymosis.  Neuro: Cranial nerves intact, reflexes equal bilaterally. Normal muscle tone, no cerebellar symptoms. Sensation intact.  Psych: Awake and oriented X 3, normal affect, Insight and Judgment appropriate.   Vicie Mutters 10:35 AM

## 2019-12-21 ENCOUNTER — Other Ambulatory Visit: Payer: Self-pay

## 2019-12-21 ENCOUNTER — Ambulatory Visit (INDEPENDENT_AMBULATORY_CARE_PROVIDER_SITE_OTHER): Payer: Self-pay | Admitting: Physician Assistant

## 2019-12-21 ENCOUNTER — Encounter: Payer: Self-pay | Admitting: Physician Assistant

## 2019-12-21 VITALS — BP 120/68 | HR 77 | Temp 97.5°F | Wt 149.6 lb

## 2019-12-21 DIAGNOSIS — Z8619 Personal history of other infectious and parasitic diseases: Secondary | ICD-10-CM

## 2019-12-21 DIAGNOSIS — F17200 Nicotine dependence, unspecified, uncomplicated: Secondary | ICD-10-CM

## 2019-12-21 DIAGNOSIS — Z79899 Other long term (current) drug therapy: Secondary | ICD-10-CM

## 2019-12-21 DIAGNOSIS — E785 Hyperlipidemia, unspecified: Secondary | ICD-10-CM

## 2019-12-21 DIAGNOSIS — E559 Vitamin D deficiency, unspecified: Secondary | ICD-10-CM

## 2019-12-21 DIAGNOSIS — F325 Major depressive disorder, single episode, in full remission: Secondary | ICD-10-CM

## 2019-12-21 DIAGNOSIS — I10 Essential (primary) hypertension: Secondary | ICD-10-CM

## 2019-12-21 MED ORDER — ACYCLOVIR 400 MG PO TABS: ORAL_TABLET | ORAL | 2 refills | Status: DC

## 2019-12-21 NOTE — Patient Instructions (Addendum)
Check out [employer] Light therapy Lamp, Ultra Thin UV Free 10000 Lux therapy Light on Amazon- it is 35 dollars.   Get on vitamin D 2000 or 5000  Managing Seasonal Affective Disorder Seasonal affective disorder (SAD) is a type of depression. A person who has SAD feels depressed at specific times of the year. The most common times for this condition are late fall and winter. How to manage lifestyle changes Managing stress  Get 6-8 hours of sleep every night. To improve your sleep, make sure you: ? Keep your bedroom dark and cool. ? Go to sleep and wake up at about the same time every day. ? Limit screen time starting a few hours before bedtime.  Exercise regularly.  Avoid making major decisions until you feel better.  Practice techniques to manage your stress, such as: ? Mindfulness-based meditation. ? Focused breathing exercises. ? Relaxation exercises. ? Journaling.  Find activities that help you relax, like reading, getting a massage, or spending time with a friend.  Do mind-body exercises, like yoga or tai chi. Managing treatment  Treatment for SAD includes talk therapy, light therapy, combination of both talk and light therapy, and medicines for depression (antidepressants). If you take medicine for SAD, avoid using alcohol and other substances that may prevent your medicine from working properly. It is also important to:  Talk with your pharmacist or health care provider about all the medicines that you take, their possible side effects, and what medicines are safe to take together.  Ask to be referred to a therapist who can help with SAD.  Use a light box for 30 minutes every morning. This should start in the early fall and continue until spring.  Take part in all treatment decisions (shared decision-making). Give input on the side effects of medicines. Shared decision-making should be part of your total treatment plan.  Know when you can expect to start feeling better.  Antidepressants take a while to start working. Let your health care provider know if you are not feeling better after the expected time.  Make sure you know all the side effects of treatment. Know which ones are serious enough to call your health care provider about.  Relationships  Educate your friends and family members about SAD so they can understand how it affects you.  Ask for support from friends and family members to help you manage SAD. Consider asking family members to participate in family therapy.  Consider joining a support group. This can help you express yourself and learn how others manage SAD. General instructions  Do your best to stay positive by remembering that SAD usually goes away after a few months.  Work with your health care providers on a treatment plan. Stick to your plan. Make a plan that includes routines such as talk therapy and light therapy.  Make your home and work environment sunny or bright. Open window blinds. Spend as much time as possible outside. Move furniture closer to windows for exposure to natural light. How to recognize changes in your condition As your SAD improves, you may:  Start to feel your mood get better.  Have more energy for daily activities.  Get back to your normal routines for sleeping and eating.  Think more clearly and be less agitated.  Feel better about yourself.  Feel joy and interest return. If your SAD gets worse, you may:  Avoid people and activities.  Feel guilty and hopeless.  Not eat well, sleep well, or exercise. Follow these instructions  at home:  Use a light box and spend as much time in natural daylight as you can.  Stay busy with activities that get you out of the house. Do activities that you enjoy. Consider making plans for a daily walk with friends or family.  Eat healthy foods, including plenty of fruits and vegetables. Limit foods that contain a lot of fat or sugar. Do not binge on sweets and  other carbs.  Limit alcohol and caffeine as told by your health care provider.  Keep all follow-up visits as told by your health care provider. This is important. Where to find support Talking to others Consider joining an online or in-person support group for people with SAD. You can also talk with:  A therapist.  Your health care provider.  Friends and family.  Online support groups at FuneralShow.uy. Finances  Check with your insurance to make sure that treatment for SAD is covered.  If you have a problem paying for co-pays or counseling, ask to meet with a Education officer, museum for help.  If your medicines are expensive: ? Use a generic form of the medicine. This may be less expensive than a brand name medicine. ? Check to see whether the manufacturer offers assistance programs to patients who cannot afford their medicines. Where to find more information  American Psychological Association: https://taylor.info/.aspx  American Psychiatric Association: psychiatry.org/patients-families/depression/seasonal-affective-disorder  [employer] of Mental Health: ReserveSpaces.se.shtml Contact a health care provider if:  Your symptoms get worse or do not get better.  You have trouble taking care of yourself.  You are using drugs or alcohol to manage your symptoms.  You have side effects from medicines.  Your symptoms return. Get help right away if:  You have thoughts about hurting yourself or others. If you ever feel like you may hurt yourself or others, or have thoughts about taking your own life, get help right away. You can go to your nearest emergency department or call:  Your local emergency services (911 in the [place]  A suicide crisis helpline, such as the [place] at 210-857-2220. This is open 24 hours a day. Summary  SAD is a type of depression that typically  occurs during the fall and winter months when there is less daylight. It is rare but possible for SAD to happen during the summer.  SAD causes symptoms of depression that can range from mild to severe.  Lifestyle changes are an important part of managing SAD. Make a plan that includes routines to help you stay active and focused.  Follow your health care provider's treatment plan, which may include talk therapy, light therapy, or use of medicines.  Get help right away if you feel like you might hurt yourself or others, or have thoughts of taking your own life. Call your local emergency line (911 in the [place] or the National Suicide Prevention Lifeline at 815-635-2599. This is open 24 hours a day. This information is not intended to replace advice given to you by your health care provider. Make sure you discuss any questions you have with your health care provider. Document Revised: 02/04/2019 Document Reviewed: 01/20/2018 Elsevier Patient Education  2020 [employer] D IS IMPORTANT  Vitamin D goal is between 60-80  Please make sure that you are taking your Vitamin D as directed.   It is very important as a natural anti-inflammatory   helping hair, skin, and nails, as well as reducing stroke and heart attack risk.   It helps your  bones and helps with mood.  We want you on at least 5000 IU daily  It also decreases numerous cancer risks so please take it as directed.   Low Vit D is associated with a 200-300% higher risk for CANCER   and 200-300% higher risk for HEART   ATTACK  &  STROKE.    .....................................Marland Kitchen  It is also associated with higher death rate at younger ages,   autoimmune diseases like Rheumatoid arthritis, Lupus, Multiple Sclerosis.     Also many other serious conditions, like depression, Alzheimer's  Dementia, infertility, muscle aches, fatigue, fibromyalgia - just to name a few.  +++++++++++++++++++  Can get liquid vitamin D  from [place] here in [place] at  [facility] alternatives [address], [place], [place] [address] Or you can try earth fare

## 2020-01-25 ENCOUNTER — Other Ambulatory Visit: Payer: Self-pay | Admitting: Physician Assistant

## 2020-01-25 DIAGNOSIS — F419 Anxiety disorder, unspecified: Secondary | ICD-10-CM

## 2020-02-27 ENCOUNTER — Other Ambulatory Visit: Payer: Self-pay | Admitting: Physician Assistant

## 2020-02-27 DIAGNOSIS — F419 Anxiety disorder, unspecified: Secondary | ICD-10-CM

## 2020-03-20 ENCOUNTER — Other Ambulatory Visit: Payer: Self-pay

## 2020-03-20 ENCOUNTER — Ambulatory Visit: Payer: Self-pay | Admitting: Adult Health Nurse Practitioner

## 2020-03-20 ENCOUNTER — Encounter: Payer: Self-pay | Admitting: Adult Health Nurse Practitioner

## 2020-03-20 VITALS — BP 124/88 | HR 97 | Temp 97.0°F | Ht 62.0 in | Wt 151.8 lb

## 2020-03-20 DIAGNOSIS — J0100 Acute maxillary sinusitis, unspecified: Secondary | ICD-10-CM

## 2020-03-20 DIAGNOSIS — R062 Wheezing: Secondary | ICD-10-CM

## 2020-03-20 DIAGNOSIS — R0989 Other specified symptoms and signs involving the circulatory and respiratory systems: Secondary | ICD-10-CM

## 2020-03-20 MED ORDER — AMOXICILLIN-POT CLAVULANATE 875-125 MG PO TABS: 1.0000 | ORAL_TABLET | Freq: Two times a day (BID) | ORAL | 0 refills | Status: DC

## 2020-03-20 MED ORDER — DEXAMETHASONE 0.5 MG PO TABS: ORAL_TABLET | ORAL | 0 refills | Status: DC

## 2020-03-20 MED ORDER — ALBUTEROL SULFATE HFA 108 (90 BASE) MCG/ACT IN AERS: 2.0000 | INHALATION_SPRAY | Freq: Four times a day (QID) | RESPIRATORY_TRACT | 2 refills | Status: DC | PRN

## 2020-03-20 NOTE — Patient Instructions (Addendum)
   You can try Allegra (fexofenadine) OR Xyzal (levocirtirazine) instead of syrtec to see if this makes a difference.  Continue to treat the symptoms.  Take Mucinex every 12 hours to help thin mucus.  We have sent in two prescriptions to your pharmacy.  Please let us know if you have any new or worsening symptoms.

## 2020-03-20 NOTE — Progress Notes (Signed)
Assessment and Plan:  Sylvia Mclean was seen today for allergies.  Diagnoses and all orders for this visit:  Chest congestion Mucinex Q12 Increase water intake  Acute maxillary sinusitis, recurrence not specified -     dexamethasone (DECADRON) 0.5 MG tablet; Take 1 tab 3 x day - 3 days, then 2 x day - 3 days, then 1 tab daily -     amoxicillin-clavulanate (AUGMENTIN) 875-125 MG tablet; Take 1 tablet by mouth 2 (two) times daily. 7 days  Wheezing -     albuterol (VENTOLIN HFA) 108 (90 Base) MCG/ACT inhaler; Inhale 2 puffs into the lungs every 6 (six) hours as needed for wheezing or shortness of breath.   Contact office with any new or worsening symptoms.  Further disposition pending results of labs. Discussed med's effects and SE's.   Over 30 minutes of face to face interview, exam, counseling, chart review, and critical decision making was performed.   Future Appointments  Date Time Provider [place]  03/20/2020  9:45 AM [name], NP [facility] None  06/25/2020  9:00 AM Vicie Mutters, PA-C [facility] None    ------------------------------------------------------------------------------------------------------------------   HPI Sylvia Mclean presents for evaluation of allergy symptoms that started 10 days ago.  She was taking flonase and zyrtec.  She did try a sample of Breo once a day which help with her breathing but not other symptoms.  She also has recuse inhaler but has not had to use this.  She does report a cough that is productive intermittently.  She reports that it is worse in the mornings.  She denies increase in coughing with laying down. She also tried some mucinex sinus max to help taking twice a day.  She ran out yesterday. She is having facial tendnerness and also sinus congestion.  Denies any headaches, otalgia, sore throat, fever or chills.  Past Medical History:  Diagnosis Date  . Allergy   . Anxiety   . Depression   . Hypertension 2006      Allergies  Allergen Reactions  . Azithromycin Nausea And Vomiting  . Prednisone     Post menopausal bleeding    Current Outpatient Medications on File Prior to Visit  Medication Sig  . acyclovir (ZOVIRAX) 400 MG tablet TAKE ONE TABLET 3 TIMES A DAY AS NEEDED.  Marland Kitchen albuterol (PROVENTIL HFA;VENTOLIN HFA) 108 (90 Base) MCG/ACT inhaler Inhale 2 puffs into the lungs every 6 (six) hours as needed for wheezing or shortness of breath.  . ALPRAZolam (XANAX) 0.5 MG tablet TAKE 0.5-1 TABLET BY MOUTH TWO TIMES A DAY AS NEEDED FOR ANXIETY *MUST LAST LONGER THAN ONE MONTH*  . cetirizine (ZYRTEC) 10 MG tablet Take 10 mg by mouth daily.  . DULoxetine (CYMBALTA) 60 MG capsule TAKE 1 CAPSULE BY MOUTH EVERY DAY  . OVER THE COUNTER MEDICATION Uses OTC Flonase nasal spray PRN.  . triamcinolone cream (KENALOG) 0.5 % Apply 1 application topically 2 (two) times daily.   No current facility-administered medications on file prior to visit.    ROS: all negative except above.   Physical Exam:  LMP 05/30/2013   General Appearance: Well nourished, in no apparent distress. Eyes: PERRLA, EOMs, conjunctiva no swelling or erythema Sinuses: Frontal/maxillary tenderness noted. ENT/Mouth: Ext aud canals clear, TMs without erythema, bulging. No erythema, swelling, or exudate on post pharynx but erythema noted.  Tonsils not swollen or erythematous. Hearing normal.  Neck: Supple, thyroid normal.  Respiratory: Respiratory effort normal, BS equal bilaterally without rales,wheezing or stridor. Rhonchi noted bilaterally.  Cardio:  RRR with no MRGs. Brisk peripheral pulses without edema.  Abdomen: Soft, + BS.  Non tender, no guarding, rebound, hernias, masses. Lymphatics: Non tender without lymphadenopathy.  Musculoskeletal: Full ROM, 5/5 strength, normal gait.  Skin: Warm, dry without rashes, lesions, ecchymosis.  Neuro: Cranial nerves intact. Normal muscle tone, no cerebellar symptoms. Sensation intact.  Psych: Awake  and oriented X 3, normal affect, Insight and Judgment appropriate.     [name], NP 10:43 AM [facility]

## 2020-04-02 ENCOUNTER — Other Ambulatory Visit: Payer: Self-pay | Admitting: Physician Assistant

## 2020-04-02 DIAGNOSIS — F419 Anxiety disorder, unspecified: Secondary | ICD-10-CM

## 2020-04-16 ENCOUNTER — Telehealth: Payer: Self-pay

## 2020-04-16 DIAGNOSIS — R0989 Other specified symptoms and signs involving the circulatory and respiratory systems: Secondary | ICD-10-CM

## 2020-04-16 MED ORDER — DOXYCYCLINE HYCLATE 100 MG PO CAPS: ORAL_CAPSULE | ORAL | 0 refills | Status: DC

## 2020-04-16 NOTE — Telephone Encounter (Signed)
Patient was seen on 03/20/20 for upper respiratory infection. As of yesterday, her symptoms have returned.  It isn't in her chest yet, but is requesting another round of Augmentin.

## 2020-04-16 NOTE — Addendum Note (Signed)
Addended by: Vicie Mutters R on: 04/16/2020 01:01 PM   Modules accepted: Orders

## 2020-04-17 NOTE — Telephone Encounter (Signed)
Spoke with patient, informed her about getting xray done. Is planning on going today. Had to reactivate her MyChart account.

## 2020-05-02 ENCOUNTER — Other Ambulatory Visit: Payer: Self-pay | Admitting: Adult Health

## 2020-05-02 DIAGNOSIS — F419 Anxiety disorder, unspecified: Secondary | ICD-10-CM

## 2020-06-04 ENCOUNTER — Other Ambulatory Visit: Payer: Self-pay | Admitting: Physician Assistant

## 2020-06-04 DIAGNOSIS — F419 Anxiety disorder, unspecified: Secondary | ICD-10-CM

## 2020-06-05 ENCOUNTER — Other Ambulatory Visit: Payer: Self-pay | Admitting: Adult Health Nurse Practitioner

## 2020-06-19 ENCOUNTER — Encounter: Payer: Self-pay | Admitting: Physician Assistant

## 2020-06-24 NOTE — Progress Notes (Signed)
CPE- no insurance Assessment and Plan: Skin nodule Aggressive growth over 3 months, warm, tender, getting larger Discussed with Dr. Melford Aase and he has seen the nodule and feels comfortable with removal here since patient is self pay Rule out cutaneous SSC verus glumas tumor/granuloma/spiz/scar tissue.  Essential hypertension - continue medications, DASH diet, exercise and monitor at home. Call if greater than 130/80.   Depression, major, in remission ([place]) Depression- continue medications, stress management techniques discussed, increase water, good sleep hygiene discussed, increase exercise, and increase veggies.  - DULoxetine (CYMBALTA) 60 MG capsule; Take 1 capsule (60 mg total) by mouth daily.  Dispense: 30 capsule; Refill: 5  Tobacco use disorder Advised to stop smoking,  not ready to quit at this time but she is cutting back  Anxiety Continue xanax PRN, discussed addictive nature and that patient should limit use.    Gastroesophageal reflux disease, esophagitis presence not specified Continue PPI/H2 blocker, diet discussed  Allergy, subsequent encounter Continue OTC meds   Hyperlipidemia -decrease fatty foods, increase activity - check labs.   Vitamin D deficiency   Medication management  Encounter for general adult medical examination with abnormal findings Needs COLONOSCOPY- wants to wait until insurance NEEDS MGM- [facility]    Discussed med's effects and SE's. Screening labs and tests as requested with regular follow-up as recommended.  HPI 59 y.o. female  presents for a complete physical, SHE IS SELF PAY.  Laid off 2018, states stress is less. Depression in remission, on cymbalta AND xanax as needed 0.5mg .  She has had elevated blood pressure since 2006. Her blood pressure has been controlled at home, SHE STOPPED HER BP MEDS today their BP is BP: 120/84   BMI is Body mass index is 27.72 kg/m., she is working on diet and exercise. Wt  Readings from Last 3 Encounters:  06/25/20 154 lb (69.9 kg)  03/20/20 151 lb 12.8 oz (68.9 kg)  12/21/19 149 lb 9.6 oz (67.9 kg)   She does not workout. She denies chest pain, shortness of breath, dizziness.  She continue to smoke HAS CUT BACK TO LESS THAN 1/2 pack a day.  She is not on cholesterol medication and denies myalgias. Her cholesterol is at goal. The cholesterol last visit was:   Lab Results  Component Value Date   CHOL 186 06/14/2019   HDL 76 06/14/2019   LDLCALC 92 06/14/2019   TRIG 90 06/14/2019   CHOLHDL 2.4 06/14/2019   Last A1C in the office was:  Lab Results  Component Value Date   HGBA1C 5.5 05/14/2015   Patient was on Vitamin D supplement has not been on it.  Lab Results  Component Value Date   VD25OH 38 05/14/2016    Current Medications:  Current Outpatient Medications on File Prior to Visit  Medication Sig  . acyclovir (ZOVIRAX) 400 MG tablet TAKE ONE TABLET 3 TIMES A DAY AS NEEDED.  Marland Kitchen albuterol (VENTOLIN HFA) 108 (90 Base) MCG/ACT inhaler Inhale 2 puffs into the lungs every 6 (six) hours as needed for wheezing or shortness of breath.  . ALPRAZolam (XANAX) 0.5 MG tablet TAKE 1/2-1 TABLET BY MOUTH TWO TIMES A DAY AS NEEDED FOR ANXIETY. DO NOT TAKE DAILY, SHOULD LAST LONGER THAN 30 DAYS  . amoxicillin-clavulanate (AUGMENTIN) 875-125 MG tablet Take 1 tablet by mouth 2 (two) times daily. 7 days  . cetirizine (ZYRTEC) 10 MG tablet Take 10 mg by mouth daily.  Marland Kitchen dexamethasone (DECADRON) 0.5 MG tablet Take 1 tab 3 x day -  3 days, then 2 x day - 3 days, then 1 tab daily  . doxycycline (VIBRAMYCIN) 100 MG capsule Take 1 capsule twice daily with food  . DULoxetine (CYMBALTA) 60 MG capsule TAKE 1 CAPSULE BY MOUTH EVERY DAY  . OVER THE COUNTER MEDICATION Uses OTC Flonase nasal spray PRN.  . triamcinolone cream (KENALOG) 0.5 % Apply 1 application topically 2 (two) times daily.   No current facility-administered medications on file prior to visit.   Health  Maintenance:   Immunization History  Administered Date(s) Administered  . Tdap 05/09/2013   Tetanus: 2014 Pneumovax: N/A Prevnar 13: N/A Flu vaccine: declines Zostavax: N/A COVID vaccines: declines  Pap: 12/2014 US pelvis 12/2014 MGM: 12/2014 normal, DUE DEXA: N/A Colonoscopy: NEEDS wants to wait until she gets insurance EGD: N/A CXR 06/2016  DEE: May 2016 Dr. Charma Igo Patient Care Team: Unk Pinto, MD as PCP - General (Internal Medicine) Crista Luria, MD as Consulting Physician (Dermatology) Mosetta Anis, MD as Referring Physician (Allergy)   Problem List has Hypertension; Depression, major, in remission ([place]); Anxiety; Allergy; GERD (gastroesophageal reflux disease); Tobacco use disorder; and Hyperlipidemia on their problem list.  Allergies Allergies  Allergen Reactions  . Azithromycin Nausea And Vomiting  . Prednisone     Post menopausal bleeding    SURGICAL HISTORY She  has a past surgical history that includes Tonsillectomy and adenoidectomy and Cesarean section. FAMILY HISTORY Her family history includes Heart attack in her father; Hyperlipidemia in her father; Hypertension in her father. SOCIAL HISTORY She  reports that she has been smoking cigarettes. She has a 10.00 pack-year smoking history. She has never used smokeless tobacco. She reports current alcohol use of about 6.0 standard drinks of alcohol per week. She reports that she does not use drugs.  Review of Systems  Constitutional: Negative.   HENT: Negative.   Eyes: Negative.   Respiratory: Negative.   Cardiovascular: Negative.   Gastrointestinal: Negative.   Genitourinary: Negative.   Musculoskeletal: Negative.   Skin: Negative.   Neurological: Negative.   Endo/Heme/Allergies: Negative.   Psychiatric/Behavioral: Negative.    Physical Exam: Estimated body mass index is 27.72 kg/m as calculated from the following:   Height as of this encounter: 5' 2.5" (1.588 m).   Weight as of  this encounter: 154 lb (69.9 kg). BP 120/84   Pulse 67   Temp (!) 97.5 F (36.4 C)   Ht 5' 2.5" (1.588 m)   Wt 154 lb (69.9 kg)   LMP 05/30/2013   SpO2 97%   BMI 27.72 kg/m  General Appearance: Well nourished, in no apparent distress. Eyes: PERRLA, EOMs, conjunctiva no swelling or erythema, normal fundi and vessels. Sinuses: No Frontal/maxillary tenderness ENT/Mouth: Ext aud canals clear, normal light reflex with TMs without erythema, bulging.  Good dentition. No erythema, swelling, or exudate on post pharynx. Tonsils not swollen or erythematous. Hearing normal.  Neck: Supple, thyroid normal. No bruits Respiratory: Respiratory effort normal, BS equal bilaterally without rales, rhonchi, wheezing or stridor. Cardio: RRR without murmurs, rubs or gallops. Brisk peripheral pulses without edema.  Chest: symmetric, with normal excursions and percussion. Breasts: Symmetric, + FBD, without nipple discharge, retractions. Abdomen: Soft, +BS. Non tender, no guarding, rebound, hernias, masses, or organomegaly. .  Lymphatics: Non tender without lymphadenopathy.  Genitourinary: defer Musculoskeletal: Full ROM all peripheral extremities,5/5 strength, and normal gait. 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 Left lateral wrist with 2.4x 2 cm warm, hard, tender nodule with erythematous base, very mobile, very demarcated.  Warm, dry  without rashes, lesions, ecchymosis.  Neuro: Cranial nerves intact, reflexes equal bilaterally. Normal muscle tone, no cerebellar symptoms. Sensation intact.  Psych: Awake and oriented X 3, normal affect, Insight and Judgment appropriate.       EKG: defer AORTA SCAN:  defer   Vicie Mutters 9:19 AM

## 2020-06-25 ENCOUNTER — Ambulatory Visit (INDEPENDENT_AMBULATORY_CARE_PROVIDER_SITE_OTHER): Payer: Self-pay | Admitting: Physician Assistant

## 2020-06-25 ENCOUNTER — Other Ambulatory Visit: Payer: Self-pay

## 2020-06-25 ENCOUNTER — Encounter: Payer: Self-pay | Admitting: Physician Assistant

## 2020-06-25 VITALS — BP 120/84 | HR 67 | Temp 97.5°F | Ht 62.5 in | Wt 154.0 lb

## 2020-06-25 DIAGNOSIS — F17200 Nicotine dependence, unspecified, uncomplicated: Secondary | ICD-10-CM

## 2020-06-25 DIAGNOSIS — E785 Hyperlipidemia, unspecified: Secondary | ICD-10-CM

## 2020-06-25 DIAGNOSIS — D492 Neoplasm of unspecified behavior of bone, soft tissue, and skin: Secondary | ICD-10-CM

## 2020-06-25 DIAGNOSIS — Z79899 Other long term (current) drug therapy: Secondary | ICD-10-CM

## 2020-06-25 DIAGNOSIS — F325 Major depressive disorder, single episode, in full remission: Secondary | ICD-10-CM

## 2020-06-25 DIAGNOSIS — I10 Essential (primary) hypertension: Secondary | ICD-10-CM

## 2020-06-25 DIAGNOSIS — E559 Vitamin D deficiency, unspecified: Secondary | ICD-10-CM

## 2020-06-25 NOTE — Patient Instructions (Signed)
Here is some information about the vaccines. The data is very good and this information hopefully answers a lot of your questions and give you a confidence boost.   The Pfizer and Moderna vaccines are messenger RNA vaccines. That technology is not new, it has been studied for 20 years at least, used for cancer and MS treatment. They had started using the vaccine for MERS AND SARS (both a different coronavirus) 10 years ago but never finished so we had a good backbone for this vaccine.   There were no short cuts with the techniques for these clinical  trials, just lots of willing participants quickly and lots of up front money helped speed up the process.   The mRNA is very fragile which is why it needs to be kept so cold and thawed a certain way, think of it as a message in a glass bottle. NO PART of the virus is in this vaccine, it is a clip of the genetic sequence. This mRNA is injected in your arm, connects with a ribosome, delivers the message and the degrades.  That is part of the cause of the sore arm, the mRNA never leaves your arm. It degrades there. The mRNA does not go into our nucleotide where our DNA is, and we would need a DNA reverse transcriptase to take RNA to DNA, we do not have this, it can not change our DNA. The ribosome that got the message creates a protein and that protein circulates in our body and we have an immune reaction to that creating antibodies. Any time our immune system is triggered, inflammation is triggered too so you can have a temp, muscle aches, etc. Normal reaction.  I have seen so many patients that just had mild COVID in the office weeks later still have issues. We are still learning about post COVID syndrome, the CDC should be coming out for guidelines for practioners soon. There is too much unknown about COVID. We have been using vaccines for over 100 years or more, i Personnel officer. Ask your parents or any older friends about polio and that vaccine, that was a  disease shutting down schools,had kids in iron lung, devastating young kids and families. People lined up for that vaccine and technology has only improved.   Please get the vaccine. If you have any further questions please make an appointment in the office to discuss further.  Estill Bamberg

## 2020-06-26 LAB — COMPLETE METABOLIC PANEL WITH GFR
AG Ratio: 1.8 (calc) (ref 1.0–2.5)
ALT: 9 U/L (ref 6–29)
AST: 15 U/L (ref 10–35)
Albumin: 4.3 g/dL (ref 3.6–5.1)
Alkaline phosphatase (APISO): 69 U/L (ref 37–153)
BUN: 15 mg/dL (ref 7–25)
CO2: 28 mmol/L (ref 20–32)
Calcium: 9.5 mg/dL (ref 8.6–10.4)
Chloride: 107 mmol/L (ref 98–110)
Creat: 0.69 mg/dL (ref 0.50–1.05)
GFR, Est African American: 110 mL/min/{1.73_m2} (ref >=60)
GFR, Est Non African American: 95 mL/min/{1.73_m2} (ref >=60)
Globulin: 2.4 g/dL (calc) (ref 1.9–3.7)
Glucose, Bld: 86 mg/dL (ref 65–99)
Potassium: 5 mmol/L (ref 3.5–5.3)
Sodium: 141 mmol/L (ref 135–146)
Total Bilirubin: 0.4 mg/dL (ref 0.2–1.2)
Total Protein: 6.7 g/dL (ref 6.1–8.1)

## 2020-06-26 LAB — CBC WITH DIFFERENTIAL/PLATELET
Absolute Monocytes: 429 cells/uL (ref 200–950)
Basophils Absolute: 50 cells/uL (ref 0–200)
Basophils Relative: 0.9 %
Eosinophils Absolute: 231 cells/uL (ref 15–500)
Eosinophils Relative: 4.2 %
HCT: 45.5 % — ABNORMAL HIGH (ref 35.0–45.0)
Hemoglobin: 15.4 g/dL (ref 11.7–15.5)
Lymphs Abs: 1815 cells/uL (ref 850–3900)
MCH: 32.5 pg (ref 27.0–33.0)
MCHC: 33.8 g/dL (ref 32.0–36.0)
MCV: 96 fL (ref 80.0–100.0)
MPV: 9.4 fL (ref 7.5–12.5)
Monocytes Relative: 7.8 %
Neutro Abs: 2976 cells/uL (ref 1500–7800)
Neutrophils Relative %: 54.1 %
Platelets: 266 10*3/uL (ref 140–400)
RBC: 4.74 10*6/uL (ref 3.80–5.10)
RDW: 12.7 % (ref 11.0–15.0)
Total Lymphocyte: 33 %
WBC: 5.5 10*3/uL (ref 3.8–10.8)

## 2020-07-01 NOTE — Progress Notes (Signed)
   History of Present Illness:    Patient is a very nice 59 yo WF with hx/o HTN who presents for excision of a suspect lesion of her dorsal Left wrist that has rapidly increased in size over the last  2-3 months.     Medications  .  Albuterol  HFA inhaler, Inhale 2 puffs every 6  hours as needed  .  cetirizine ( 10 MG tablet, Take 10 mg by mouth daily.  Marland Kitchen  acyclovir 400 MG tablet, TAKE ONE TABLET 3 TIMES A DAY AS NEEDED. Marland Kitchen  ALPRAZolam  0.5 MG tablet, TAKE 1/2-1 TABLET  2 x /DAY AS NEEDED  .  DULoxetine  60 MG capsule, TAKE 1 CAPSULE  EVERY DAY .  OTC Flonase nasal spray PRN. .  triamcinolone crm  0.5 %, Apply 1 application topically 2 (two) times daily.  Problem list She has Hypertension; Depression, major, in remission ([place]); Anxiety; Allergy; GERD (gastroesophageal reflux disease); Tobacco use disorder; and Hyperlipidemia on their problem list.   Observations/Objective:   BP 124/90   Pulse 63   Temp (!) 97.3 F (36.3 C)   Wt 153 lb (69.4 kg)   LMP 05/30/2013   SpO2 98%   BMI 27.54 kg/m   Skin exam focused on the Left wrist. There is a 2 cm x 2.75 cm smooth firm 1 cm raised reddish pink lesion over the dorsal Left wrist which is not fixed.   Procedure       After informed consent and aseptic prep with alcohol, the operative site was anesthetized locally with  4 ml of Marcaine 0.5%. Then with  a #10 scalpel, the lesion was excised  full thickness in toto in a transverse elliptical  Fashion. Then the wound edges were approximated with  # 6 Vertical mattress sutures of Nylon 3-0. Then the wound edges were aligned, everted and secured with a running lock suture (#6) of Nylon 3-0. Sterile dressing and 2 " ace wrap applied . Patient instructed in wound care & asked to return in 3 days to recheck. Lesion sent for path to r/o neoplasm.  Assessment and Plan:  1. Essential hypertension   2. 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 The patient was provided an opportunity to ask questions and all were answered. The patient agreed with the plan and demonstrated an understanding of the instructions.    Kirtland Bouchard, MD

## 2020-07-03 ENCOUNTER — Other Ambulatory Visit: Payer: Self-pay | Admitting: Internal Medicine

## 2020-07-03 ENCOUNTER — Encounter: Payer: Self-pay | Admitting: Internal Medicine

## 2020-07-03 ENCOUNTER — Ambulatory Visit (INDEPENDENT_AMBULATORY_CARE_PROVIDER_SITE_OTHER): Payer: Self-pay | Admitting: Internal Medicine

## 2020-07-03 ENCOUNTER — Other Ambulatory Visit: Payer: Self-pay

## 2020-07-03 VITALS — BP 124/90 | HR 63 | Temp 97.3°F | Ht 62.0 in | Wt 153.0 lb

## 2020-07-03 DIAGNOSIS — I10 Essential (primary) hypertension: Secondary | ICD-10-CM

## 2020-07-03 DIAGNOSIS — D485 Neoplasm of uncertain behavior of skin: Secondary | ICD-10-CM

## 2020-07-06 ENCOUNTER — Ambulatory Visit (INDEPENDENT_AMBULATORY_CARE_PROVIDER_SITE_OTHER): Payer: Self-pay | Admitting: Internal Medicine

## 2020-07-06 ENCOUNTER — Other Ambulatory Visit: Payer: Self-pay

## 2020-07-06 VITALS — BP 126/86 | HR 76 | Temp 97.1°F | Resp 16 | Ht 62.0 in | Wt 153.6 lb

## 2020-07-06 DIAGNOSIS — D485 Neoplasm of uncertain behavior of skin: Secondary | ICD-10-CM

## 2020-07-06 NOTE — Progress Notes (Signed)
     Path still pending     Wound appears clean w/o signs of infection     Non-Adhesive bandage applied      ROV 1 week to recheck

## 2020-07-11 ENCOUNTER — Other Ambulatory Visit: Payer: Self-pay | Admitting: Internal Medicine

## 2020-07-11 DIAGNOSIS — C4362 Malignant melanoma of left upper limb, including shoulder: Secondary | ICD-10-CM

## 2020-07-12 ENCOUNTER — Encounter: Payer: Self-pay | Admitting: Internal Medicine

## 2020-07-12 ENCOUNTER — Ambulatory Visit: Payer: Self-pay | Admitting: Internal Medicine

## 2020-07-12 ENCOUNTER — Other Ambulatory Visit: Payer: Self-pay

## 2020-07-12 DIAGNOSIS — F419 Anxiety disorder, unspecified: Secondary | ICD-10-CM

## 2020-07-12 MED ORDER — ALPRAZOLAM 0.5 MG PO TABS: ORAL_TABLET | ORAL | 0 refills | Status: DC

## 2020-07-12 NOTE — Progress Notes (Signed)
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   Wound appears clean w/o signs of infection   #7 vertical mattress sutures removed / running lock suture remains  ROV in 4-5 days for final suture removal ============================================================  Path report reviewed with patient & husband Sylvia Mclean)   Referral place for patient to see Dr Johnathan Hausen for  further evaluation & management ============================================================

## 2020-07-13 ENCOUNTER — Ambulatory Visit: Payer: Self-pay | Admitting: Internal Medicine

## 2020-07-17 ENCOUNTER — Ambulatory Visit: Payer: Self-pay | Admitting: Internal Medicine

## 2020-07-17 ENCOUNTER — Other Ambulatory Visit: Payer: Self-pay

## 2020-07-17 ENCOUNTER — Encounter: Payer: Self-pay | Admitting: Internal Medicine

## 2020-07-17 VITALS — BP 114/76 | HR 94 | Temp 97.3°F | Ht 62.0 in | Wt 150.0 lb

## 2020-07-17 DIAGNOSIS — J4520 Mild intermittent asthma, uncomplicated: Secondary | ICD-10-CM

## 2020-07-17 DIAGNOSIS — C4362 Malignant melanoma of left upper limb, including shoulder: Secondary | ICD-10-CM | POA: Insufficient documentation

## 2020-07-17 DIAGNOSIS — Z85820 Personal history of malignant melanoma of skin: Secondary | ICD-10-CM | POA: Insufficient documentation

## 2020-07-17 MED ORDER — DEXAMETHASONE 4 MG PO TABS: ORAL_TABLET | ORAL | 0 refills | Status: DC

## 2020-07-17 MED ORDER — AZITHROMYCIN 250 MG PO TABS: ORAL_TABLET | ORAL | 1 refills | Status: DC

## 2020-07-17 NOTE — Progress Notes (Signed)
   Sylvia Mclean returns today for suture removal dorsal Left wrist s/p excision of a melanoma on 07/03/2020  Healing ridge appears competent w/o signs of infection  Running lock stitch suture removed and dressed with antibiotic &non-stick dressing  Patient scheduled to see Dr Johnathan Hausen in 2 days    Patient also  c/o congested cough.    o/exam  BP 114/76   Pulse 94   Temp (!) 97.3 F (36.3 C)   Ht 5\' 2"  (1.575 m)   Wt 150 lb (68 kg)   LMP 05/30/2013   SpO2 97%   BMI 27.44 kg/m   HEENT - Negative neck supple       Chest with few scattered rhonchi and forced expiratory coarse wheezes Cor - RRR w/o sig murmurs  Imp     Asth Bronchitis  Rx - Z-pak & decadron 4 mg taper.   Sx of Azmacort and Spriva Respimat x 2 week supply

## 2020-07-20 ENCOUNTER — Other Ambulatory Visit: Payer: Self-pay | Admitting: Surgery

## 2020-07-20 ENCOUNTER — Other Ambulatory Visit (HOSPITAL_COMMUNITY): Payer: Self-pay | Admitting: Surgery

## 2020-07-20 ENCOUNTER — Ambulatory Visit: Payer: Self-pay | Admitting: Internal Medicine

## 2020-07-20 DIAGNOSIS — C4360 Malignant melanoma of unspecified upper limb, including shoulder: Secondary | ICD-10-CM

## 2020-07-24 ENCOUNTER — Ambulatory Visit (HOSPITAL_COMMUNITY)
Admission: RE | Admit: 2020-07-24 | Discharge: 2020-07-24 | Disposition: A | Discharge status: Home / Self Care | Payer: Self-pay | Source: Ambulatory Visit | Attending: Surgery | Admitting: Surgery

## 2020-07-24 ENCOUNTER — Other Ambulatory Visit: Payer: Self-pay

## 2020-07-24 DIAGNOSIS — C4360 Malignant melanoma of unspecified upper limb, including shoulder: Secondary | ICD-10-CM | POA: Insufficient documentation

## 2020-07-24 LAB — GLUCOSE, CAPILLARY: Glucose-Capillary: 101 mg/dL — ABNORMAL HIGH (ref 70–99)

## 2020-07-24 MED ORDER — FLUDEOXYGLUCOSE F - 18 (FDG) INJECTION
8.4000 | Freq: Once | INTRAVENOUS | Status: AC
  Administered 2020-07-24: 8.4 via INTRAVENOUS

## 2020-07-26 ENCOUNTER — Ambulatory Visit (INDEPENDENT_AMBULATORY_CARE_PROVIDER_SITE_OTHER): Payer: Self-pay

## 2020-07-26 ENCOUNTER — Other Ambulatory Visit: Payer: Self-pay

## 2020-07-26 VITALS — Temp 97.3°F

## 2020-07-26 DIAGNOSIS — Z1152 Encounter for screening for COVID-19: Secondary | ICD-10-CM

## 2020-07-26 LAB — POC COVID19 BINAXNOW: SARS Coronavirus 2 Ag: NEGATIVE

## 2020-07-26 NOTE — Progress Notes (Signed)
================================  -   Rapid Covid Test is Negative  ================================

## 2020-07-27 ENCOUNTER — Other Ambulatory Visit: Payer: Self-pay | Admitting: Surgery

## 2020-07-27 ENCOUNTER — Encounter: Payer: Self-pay | Admitting: Surgery

## 2020-07-27 DIAGNOSIS — R911 Solitary pulmonary nodule: Secondary | ICD-10-CM

## 2020-07-27 DIAGNOSIS — C439 Malignant melanoma of skin, unspecified: Secondary | ICD-10-CM

## 2020-07-27 DIAGNOSIS — IMO0001 Reserved for inherently not codable concepts without codable children: Secondary | ICD-10-CM

## 2020-08-06 ENCOUNTER — Other Ambulatory Visit: Payer: Self-pay | Admitting: Internal Medicine

## 2020-08-06 DIAGNOSIS — F419 Anxiety disorder, unspecified: Secondary | ICD-10-CM

## 2020-08-08 ENCOUNTER — Other Ambulatory Visit: Payer: Self-pay | Admitting: Physician Assistant

## 2020-08-08 DIAGNOSIS — F325 Major depressive disorder, single episode, in full remission: Secondary | ICD-10-CM

## 2020-08-14 ENCOUNTER — Encounter (HOSPITAL_COMMUNITY): Payer: Self-pay

## 2020-08-14 NOTE — Progress Notes (Signed)
Jearld Lesch. Brent Female, 59 y.o., [birth year] MRN:  [id number] Phone:  [phone] Jerilynn Mages) PCP:  Unk Pinto, MD Primary Cvg:  None Next Appt With [address] 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 No sentinal node done yet and wont be done if prove pulm mets.   Plan for immunotherapy if prove met dz   D/w Matt Martin(who showed the films to [facility]) and want CT bx ASAP for oncology to treat her    TS   Previous Messages  ----- Message -----  From: Lenore Cordia  Sent: 08/14/2020 10:16 AM EDT  To: [facility] Procedure Requests   Procedure Requested: CT Guided Needle Placement    Reason for Procedure: Lung nodule < 6cm on CT [[id number]]  Melanoma [facility]) [[id number]]    Provider Requesting: Johnathan Hausen & Saverio Danker  Provider Telephone: [phone]    Other Info:* both put in for the same procedure request

## 2020-08-15 ENCOUNTER — Other Ambulatory Visit (HOSPITAL_COMMUNITY): Payer: Self-pay | Admitting: Surgery

## 2020-08-15 DIAGNOSIS — R911 Solitary pulmonary nodule: Secondary | ICD-10-CM

## 2020-08-15 DIAGNOSIS — C439 Malignant melanoma of skin, unspecified: Secondary | ICD-10-CM

## 2020-08-15 DIAGNOSIS — IMO0001 Reserved for inherently not codable concepts without codable children: Secondary | ICD-10-CM

## 2020-09-04 ENCOUNTER — Other Ambulatory Visit: Payer: Self-pay | Admitting: Internal Medicine

## 2020-09-04 ENCOUNTER — Other Ambulatory Visit (HOSPITAL_COMMUNITY): Payer: Self-pay

## 2020-09-04 DIAGNOSIS — F419 Anxiety disorder, unspecified: Secondary | ICD-10-CM

## 2020-09-07 ENCOUNTER — Encounter (HOSPITAL_BASED_OUTPATIENT_CLINIC_OR_DEPARTMENT_OTHER): Payer: Self-pay

## 2020-09-07 ENCOUNTER — Ambulatory Visit (HOSPITAL_BASED_OUTPATIENT_CLINIC_OR_DEPARTMENT_OTHER): Admit: 2020-09-07 | Payer: Self-pay | Admitting: Surgery

## 2020-09-07 SURGERY — MELANOMA EXCISION WITH SENTINEL LYMPH NODE BIOPSY
Anesthesia: General | Laterality: Left

## 2020-10-08 ENCOUNTER — Other Ambulatory Visit: Payer: Self-pay | Admitting: Internal Medicine

## 2020-10-08 DIAGNOSIS — F419 Anxiety disorder, unspecified: Secondary | ICD-10-CM

## 2020-10-08 MED ORDER — ALPRAZOLAM 0.5 MG PO TABS: ORAL_TABLET | ORAL | 0 refills | Status: DC

## 2020-11-05 ENCOUNTER — Other Ambulatory Visit: Payer: Self-pay | Admitting: Internal Medicine

## 2020-11-05 DIAGNOSIS — F419 Anxiety disorder, unspecified: Secondary | ICD-10-CM

## 2020-11-07 ENCOUNTER — Encounter: Payer: Self-pay | Admitting: Adult Health Nurse Practitioner

## 2020-11-07 ENCOUNTER — Other Ambulatory Visit: Payer: Self-pay | Admitting: Adult Health Nurse Practitioner

## 2020-12-19 DIAGNOSIS — E663 Overweight: Secondary | ICD-10-CM | POA: Insufficient documentation

## 2020-12-19 NOTE — Progress Notes (Signed)
FOLLOW UP Assessment and Plan:   Essential hypertension - currently well controlled off of meds, continue DASH diet, exercise and monitor at home. Call if greater than 130/80.   Depression, major, in remission (Sylvia Mclean) Currently well controlled; discussed avoiding daily use of benzo continue medications, stress management techniques discussed, increase water, good sleep hygiene discussed, increase exercise, and increase veggies.  - DULoxetine (CYMBALTA) 60 MG capsule; Take 1 capsule (60 mg total) by mouth daily.  Dispense: 30 capsule; Refill: 5 -     ALPRAZolam (XANAX) 0.5 MG tablet; TAKE0.5-1 TABLET BY MOUTH 2-3 TIMES DAILY AS NEEDED FOR ANXIETY ATTACK. TRY TO LIMIT TO FIVE DAYS PER WEEK TO AVOID ADDICITON AND DEMENTIA  Tobacco use disorder -  instruction/counseling given, counseled patient on the dangers of tobacco use, advised patient to stop smoking, and reviewed strategies to maximize success, patient not ready to quit at this time but has cut back   Hyperlipidemia -decrease fatty foods, increase activity - last check was well controlled; financial burden with uninsured and melanoma workup more urgent- defer checking until next OV  Vitamin D deficiency Continue supplement;   Malignant melanoma of wrist, left (Sylvia Mclean) See extensive discussion; needs follow up, today states will follow up with Dr. Hassell Done to pursue follow up and further workup Left wrist appears in good order, no palpable lymph nodes, denies resp/lung sx today  Lifestyle reviewed; reduce sstress increase water, good sleep hygiene discussed, increase exercise, and increase fresh fruits/veggies, STOP SMOKING Get on bASA daily   Defering any labs today in light of financial burden with melanoma workup which is felt to be more urgent and taking precedence; will likely get basic labs prior to surgical excision with Dr. Hassell Done, avoid duplicating labs.   Discussed med's effects and SE's. Screening labs and tests as requested with  regular follow-up as recommended.  Future Appointments  Date Time Provider [place]  06/26/2021  9:00 AM [name], NP [facility] None     HPI 60 y.o. female presents for follow up, htn, hyperlipidemia, GERD, depression/anxiety, smoker, weight.   SHE IS SELF PAY.  She had growth of left wrist excised by Dr. Melford Aase on 07/06/2020, path returned melanoma, was referred to Dr. Johnathan Hausen, PET 07/24/2020 showed Multiple bilateral pulmonary nodular opacities, including a dominant 3.0 cm masslike opacity in the anterior left lower lobe. Covid 19/alternate infectious etiology wasn't ruled out, did have covid 19 test which was negative. Recommended consideration of follow up CT in 4-6 weeks. Dr. Hassell Done was planning resection and lymph node study, but lost contact with patient. Today she states felt she was doing well, wasn't sure about Dr. Hassell Done and wanted to hold off. Reviewed PET results with her, encouraged to follow up, receptive and states she will do so.   Laid off 2018, hasn't found job, husband has own business, no insurance. Depression in remission, on cymbalta AND xanax as needed 0.5mg , last filled 11/07/20, will take 1 at night for sleep, only occasionally during the day. Have cautioned against daily use, limit to <5 days/week.   BMI is Body mass index is 27.98 kg/m., she has not been working on diet and exercise. Wt Readings from Last 3 Encounters:  12/20/20 153 lb (69.4 kg)  07/17/20 150 lb (68 kg)  07/12/20 152 lb 6.4 oz (69.1 kg)   She has had elevated blood pressure since 2006. Her blood pressure has been controlled at home, today their BP is BP: 110/76  She does not workout. She denies chest pain,  shortness of breath, dizziness.   She continues to smoke HAS CUT BACK TO LESS THAN 1/2 pack a day. Declines chantix or other -  She is not on cholesterol medication and denies myalgias. Her cholesterol is at goal. The cholesterol last visit was:   Lab Results   Component Value Date   CHOL 186 06/14/2019   HDL 76 06/14/2019   LDLCALC 92 06/14/2019   TRIG 90 06/14/2019   CHOLHDL 2.4 06/14/2019   Last A1C in the office was:  Lab Results  Component Value Date   HGBA1C 5.5 05/14/2015   Patient was on Vitamin D supplement , currently taking ? 2000 vs 5000 IU daily, unsure.  Lab Results  Component Value Date   VD25OH 38 05/14/2016     Current Medications:     Current Outpatient Medications (Respiratory):  .  albuterol (VENTOLIN HFA) 108 (90 Base) MCG/ACT inhaler, Inhale 2 puffs into the lungs every 6 (six) hours as needed for wheezing or shortness of breath. .  cetirizine (ZYRTEC) 10 MG tablet, Take 10 mg by mouth daily.    Current Outpatient Medications (Other):  .  acyclovir (ZOVIRAX) 400 MG tablet, TAKE ONE TABLET 3 TIMES A DAY AS NEEDED. Marland Kitchen  ALPRAZolam (XANAX) 0.5 MG tablet, TAKE0.5-1 TABLET BY MOUTH 2-3 TIMES DAILY AS NEEDED FOR ANXIETY ATTACK. TRY TO LIMIT TO FIVE DAYS PER WEEK TO AVOID ADDICITON AND DEMENTIA .  DULoxetine (CYMBALTA) 60 MG capsule, TAKE ONE CAPSULE BY MOUTH DAILY .  OVER THE COUNTER MEDICATION, Uses OTC Flonase nasal spray PRN. .  triamcinolone cream (KENALOG) 0.5 %, Apply 1 application topically 2 (two) times daily.  Problem List has Hypertension; Depression, major, in remission (Sylvia Mclean); Anxiety; Allergy; GERD (gastroesophageal reflux disease); Tobacco use disorder; Hyperlipidemia; Malignant melanoma of wrist, left (Sylvia Mclean); and Overweight (BMI 25.0-29.9) on their problem list.  Allergies Allergies  Allergen Reactions  . Azithromycin Nausea And Vomiting  . Prednisone     Post menopausal bleeding   Surgical History: reviewed and unchanged Family History: reviewed and unchanged Social History: reviewed and unchanged   Review of Systems  Constitutional: Negative.   HENT: Negative.   Eyes: Negative.   Respiratory: Negative.   Cardiovascular: Negative.   Gastrointestinal: Negative.   Genitourinary: Negative.    Musculoskeletal: Negative.   Skin: Negative.   Neurological: Negative.   Endo/Heme/Allergies: Negative.   Psychiatric/Behavioral: Negative.    Physical Exam: Estimated body mass index is 27.98 kg/m as calculated from the following:   Height as of 07/17/20: 5\' 2"  (1.575 m).   Weight as of this encounter: 153 lb (69.4 kg). BP 110/76   Pulse 86   Temp (!) 97.2 F (36.2 C)   Wt 153 lb (69.4 kg)   LMP 05/30/2013   SpO2 95%   BMI 27.98 kg/m  General Appearance: Well nourished, in no apparent distress. Eyes: PERRLA, EOMs, conjunctiva no swelling or erythema, normal fundi and vessels. Sinuses: No Frontal/maxillary tenderness ENT/Mouth: Ext aud canals clear, normal light reflex with TMs without erythema, bulging.  Good dentition. No erythema, swelling, or exudate on post pharynx. Tonsils not swollen or erythematous. Hearing normal.  Neck: Supple, thyroid normal. No bruits Respiratory: Respiratory effort normal, BS equal bilaterally without rales, rhonchi, wheezing or stridor. Cardio: RRR without murmurs, rubs or gallops. Brisk peripheral pulses without edema.  Chest: symmetric, with normal excursions and percussion. Abdomen: Soft, +BS. Non tender, no guarding, rebound, hernias, masses, or organomegaly. .  Lymphatics: Non tender without lymphadenopathy.  Musculoskeletal: Full ROM all peripheral  extremities,5/5 strength, and normal gait. Skin: Warm, dry without rashes, lesions, ecchymosis. Well healed incision to left wrist without palpable thickening, mass.  Neuro: Cranial nerves intact, reflexes equal bilaterally. Normal muscle tone, no cerebellar symptoms. Sensation intact.  Psych: Awake and oriented X 3, normal affect, Insight and Judgment appropriate.   Sylvia Mclean 9:03 AM

## 2020-12-20 ENCOUNTER — Encounter: Payer: Self-pay | Admitting: Adult Health

## 2020-12-20 ENCOUNTER — Ambulatory Visit (INDEPENDENT_AMBULATORY_CARE_PROVIDER_SITE_OTHER): Payer: Self-pay | Admitting: Adult Health

## 2020-12-20 ENCOUNTER — Other Ambulatory Visit: Payer: Self-pay

## 2020-12-20 VITALS — BP 110/76 | HR 86 | Temp 97.2°F | Wt 153.0 lb

## 2020-12-20 DIAGNOSIS — F419 Anxiety disorder, unspecified: Secondary | ICD-10-CM

## 2020-12-20 DIAGNOSIS — F325 Major depressive disorder, single episode, in full remission: Secondary | ICD-10-CM

## 2020-12-20 DIAGNOSIS — K219 Gastro-esophageal reflux disease without esophagitis: Secondary | ICD-10-CM

## 2020-12-20 DIAGNOSIS — C4362 Malignant melanoma of left upper limb, including shoulder: Secondary | ICD-10-CM

## 2020-12-20 DIAGNOSIS — E663 Overweight: Secondary | ICD-10-CM

## 2020-12-20 DIAGNOSIS — E785 Hyperlipidemia, unspecified: Secondary | ICD-10-CM

## 2020-12-20 DIAGNOSIS — I10 Essential (primary) hypertension: Secondary | ICD-10-CM

## 2020-12-20 DIAGNOSIS — F17200 Nicotine dependence, unspecified, uncomplicated: Secondary | ICD-10-CM

## 2020-12-20 MED ORDER — ALPRAZOLAM 0.5 MG PO TABS: ORAL_TABLET | ORAL | 0 refills | Status: DC

## 2021-01-21 ENCOUNTER — Other Ambulatory Visit: Payer: Self-pay | Admitting: Adult Health

## 2021-01-21 DIAGNOSIS — F419 Anxiety disorder, unspecified: Secondary | ICD-10-CM

## 2021-02-19 ENCOUNTER — Other Ambulatory Visit: Payer: Self-pay | Admitting: Adult Health

## 2021-02-19 DIAGNOSIS — F419 Anxiety disorder, unspecified: Secondary | ICD-10-CM

## 2021-02-26 ENCOUNTER — Other Ambulatory Visit: Payer: Self-pay | Admitting: Internal Medicine

## 2021-02-26 DIAGNOSIS — F325 Major depressive disorder, single episode, in full remission: Secondary | ICD-10-CM

## 2021-02-26 MED ORDER — DULOXETINE HCL 60 MG PO CPEP: ORAL_CAPSULE | ORAL | 3 refills | Status: DC

## 2021-03-18 ENCOUNTER — Other Ambulatory Visit: Payer: Self-pay | Admitting: Adult Health

## 2021-03-18 DIAGNOSIS — F419 Anxiety disorder, unspecified: Secondary | ICD-10-CM

## 2021-03-22 ENCOUNTER — Other Ambulatory Visit: Payer: Self-pay | Admitting: Adult Health

## 2021-03-22 DIAGNOSIS — F419 Anxiety disorder, unspecified: Secondary | ICD-10-CM

## 2021-05-01 ENCOUNTER — Other Ambulatory Visit: Payer: Self-pay | Admitting: Adult Health

## 2021-05-01 DIAGNOSIS — F419 Anxiety disorder, unspecified: Secondary | ICD-10-CM

## 2021-06-11 ENCOUNTER — Other Ambulatory Visit: Payer: Self-pay | Admitting: Adult Health

## 2021-06-11 DIAGNOSIS — F419 Anxiety disorder, unspecified: Secondary | ICD-10-CM

## 2021-06-25 NOTE — Progress Notes (Signed)
Complete Physical  Assessment and Plan: Sylvia Mclean was seen today for annual exam.  Diagnoses and all orders for this visit:  Encounter for general adult medical examination with abnormal findings  Due Annually  Pt refused EKG, PAP, mammogram and further labwork than what was ordered today due to cost- advised to check with [facility] for mammogram or mobile mammogram services  Essential hypertension -     CBC with Differential/Platelet -     COMPLETE METABOLIC PANEL WITH GFR  Depression, major, in remission ([place])  Continue Cymbalta  Overweight (BMI 25.0-29.9) -     CBC with Differential/Platelet -     COMPLETE METABOLIC PANEL WITH GFR  Continue diet and exercise, strongly encouraged  Gastroesophageal reflux disease, unspecified whether esophagitis present  Symptoms well controlled, continue diet modifications  Medication management -     CBC with Differential/Platelet -     COMPLETE METABOLIC PANEL WITH GFR -     Lipid panel  Malignant melanoma of left wrist [facility]) -     Ambulatory referral to General Surgery Dr. Hassell Done.  Stressed importance of follow up due to PET scan showing multiple opacities in the lungs.  Screening, lipid -     Lipid panel  Other eczema -     triamcinolone cream (KENALOG) 0.5 %; Apply 1 application topically 2 (two) times daily. - Eczema noted in right inner ear, use triamcinolone sparingly as needed   Screening Mammogram for breast cancer  Given number for [facility] who has grant programs for uninsured  Discussed med's effects and SE's. Screening labs and tests as requested with regular follow-up as recommended. Over 40 minutes of exam, counseling, chart review, and complex, high level critical decision making was performed this visit.   HPI  60 y.o. female  presents for a complete physical and follow up for has Hypertension; Depression, major, in remission ([place]); Anxiety; Allergy; GERD (gastroesophageal reflux disease); Tobacco use  disorder; Hyperlipidemia; Malignant melanoma of wrist, left ([place]); and Overweight (BMI 25.0-29.9) on their problem list..  Laid off 2018, still hasn't found job.  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 Depression in remission, on cymbalta AND xanax as needed 0.'5mg'$ , last filled 06/12/21, will take 1 at night for sleep, only occasionally during the day. Continued caution against daily use, limit to <5 days/week.   She had growth of left wrist excised by Dr. Melford Aase on 07/06/2020, path returned melanoma, was referred to Dr. Johnathan Hausen, PET 07/24/2020 showed Multiple bilateral pulmonary nodular opacities, including a dominant 3.0 cm masslike opacity in the anterior left lower lobe. Dr. Hassell Done was planning resection and lymph node study, but lost contact with patient. PET scan showed 15-20 small opacities. Reviewed in depth again with patient .    Her blood pressure has been controlled at home, today their BP is BP: 120/84 BP Readings from Last 3 Encounters:  06/26/21 120/84  12/20/20 110/76  07/17/20 114/76    BMI is Body mass index is 27.95 kg/m., she has been working on diet and exercise. Wt Readings from Last 3 Encounters:  06/26/21 152 lb 12.8 oz (69.3 kg)  12/20/20 153 lb (69.4 kg)  07/17/20 150 lb (68 kg)    She does workout. She denies chest pain, shortness of breath, dizziness.   She is not on cholesterol medication and denies myalgias. Her cholesterol is at goal. The cholesterol last visit was:   Lab Results  Component Value Date   CHOL 186 06/14/2019   HDL 76 06/14/2019  [place] 92 06/14/2019   TRIG 90 06/14/2019   CHOLHDL 2.4 06/14/2019     Last A1C in the office was:  Lab Results  Component Value Date   HGBA1C 5.5 05/14/2015    Last GFR: Lab Results  Component Value Date   GFRNONAA 95 06/25/2020   Lab Results  Component Value Date   GFRAA 110 06/25/2020    Patient is on Vitamin D supplement.   Lab Results  Component Value Date   VD25OH 38  05/14/2016      Current Medications:  Current Outpatient Medications on File Prior to Visit  Medication Sig Dispense Refill   acyclovir (ZOVIRAX) 400 MG tablet TAKE ONE TABLET 3 TIMES A DAY AS NEEDED. 30 tablet 2   albuterol (VENTOLIN HFA) 108 (90 Base) MCG/ACT inhaler Inhale 2 puffs into the lungs every 6 (six) hours as needed for wheezing or shortness of breath. 18 g 2   ALPRAZolam (XANAX) 0.5 MG tablet TAKE 1/2 TO 1 TABLET BY MOUTH TWO TIMES A DAY AS NEEDED FOR ANXIETY ATTACK. DO NOT TAKE MORE THAN 5 DAYS PER WEEK. SCRIPT NEEDS TO LAST OVER 30 DAYS. 55 tablet 0   cetirizine (ZYRTEC) 10 MG tablet Take 10 mg by mouth daily.     DULoxetine (CYMBALTA) 60 MG capsule Take  1 capsule  Daily  for Mood & Chronic Pain 90 capsule 3   OVER THE COUNTER MEDICATION Uses OTC Flonase nasal spray PRN.     No current facility-administered medications on file prior to visit.   Allergies:  Allergies  Allergen Reactions   Azithromycin Nausea And Vomiting   Prednisone     Post menopausal bleeding   Medical History:  She has Hypertension; Depression, major, in remission ([place]); Anxiety; Allergy; GERD (gastroesophageal reflux disease); Tobacco use disorder; Hyperlipidemia; Malignant melanoma of wrist, left ([place]); and Overweight (BMI 25.0-29.9) on their problem list. Health Maintenance:   Immunization History  Administered Date(s) Administered   Tdap 05/09/2013    Tetanus: 2014 Pneumovax:declines Prevnar 13: declines Flu vaccine:declines Zostavax:declines LMP:N/A Pap: unknown, declines MGM: Unknown, given number for mammogram where grant is in place for uninsured DEXA: declines Colonoscopy: declines EGD:  Last Dental Exam:unknown Last Eye Exam: unknown Patient Care Team: Unk Pinto, MD as PCP - General (Internal Medicine) Crista Luria, MD as Consulting Physician (Dermatology) Mosetta Anis, MD as Referring Physician (Allergy)  Surgical History:  She has a past surgical history that  includes Tonsillectomy and adenoidectomy and Cesarean section. Family History:  Herfamily history includes Heart attack in her father; Hyperlipidemia in her father; Hypertension in her father. Social History:  She reports that she has been smoking cigarettes. She has a 10.00 pack-year smoking history. She has never used smokeless tobacco. She reports current alcohol use of about 6.0 standard drinks per week. She reports that she does not use drugs.  Review of Systems: Review of Systems  Constitutional:  Negative for chills and fever.  HENT:  Negative for congestion, hearing loss, sinus pain, sore throat and tinnitus.   Eyes:  Negative for blurred vision and double vision.  Respiratory:  Negative for cough, hemoptysis, sputum production, shortness of breath and wheezing.   Cardiovascular:  Negative for chest pain, palpitations and leg swelling.  Gastrointestinal:  Negative for abdominal pain, constipation, diarrhea, heartburn, nausea and vomiting.  Genitourinary:  Negative for dysuria and urgency.  Musculoskeletal:  Negative for back pain, falls, joint pain, myalgias and neck pain.  Skin:  Negative for rash.  Neurological:  Negative for dizziness,  tingling, tremors, weakness and headaches.  Endo/Heme/Allergies:  Does not bruise/bleed easily.  Psychiatric/Behavioral:  Negative for depression and suicidal ideas. The patient is not nervous/anxious and does not have insomnia.    Physical Exam: Estimated body mass index is 27.95 kg/m as calculated from the following:   Height as of this encounter: '5\' 2"'$  (1.575 m).   Weight as of this encounter: 152 lb 12.8 oz (69.3 kg). BP 120/84   Pulse 90   Temp (!) 97.1 F (36.2 C)   Resp 16   Ht '5\' 2"'$  (1.575 m)   Wt 152 lb 12.8 oz (69.3 kg)   LMP 05/30/2013   SpO2 96%   BMI 27.95 kg/m  General Appearance: Well nourished, in no apparent distress.  Eyes: PERRLA, EOMs, conjunctiva no swelling or erythema, normal fundi and vessels.  Sinuses: No  Frontal/maxillary tenderness  ENT/Mouth: Ext aud canals clear, normal light reflex with TMs without erythema, bulging. Good dentition. No erythema, swelling, or exudate on post pharynx. Tonsils not swollen or erythematous. Hearing normal.  Neck: Supple, thyroid normal. No bruits  Respiratory: Respiratory effort normal, BS equal bilaterally without rales, rhonchi, wheezing or stridor.  Cardio: RRR without murmurs, rubs or gallops. Brisk peripheral pulses without edema.  Chest: symmetric, with normal excursions and percussion.  Breasts: Symmetric, without lumps, nipple discharge, retractions.  Abdomen: Positive bowel sounds all 4 quadrants, Soft, nontender, no guarding, rebound, hernias, masses, or organomegaly.  Lymphatics: Non tender without lymphadenopathy.  Genitourinary: External genitalia without erythema, exudate or discharge. Cervix noraml color without lesions, os closed.  Uterus of normal size and nontender. No CMT noted. Adnexa without masses or tenderness. Musculoskeletal: Full ROM all peripheral extremities,5/5 strength, and normal gait.  Skin: Warm, dry without rashes, lesions, ecchymosis. Neuro: Cranial nerves intact, reflexes equal bilaterally. Normal muscle tone, no cerebellar symptoms. Sensation intact.  Psych: Awake and oriented X 3, normal affect, Insight and Judgment appropriate.   EKG: declines AORTA SCAN: declines  [place] Lebeau W Kewanda Poland 10:14 AM [facility]

## 2021-06-26 ENCOUNTER — Encounter: Payer: Self-pay | Admitting: Adult Health Nurse Practitioner

## 2021-06-26 ENCOUNTER — Other Ambulatory Visit: Payer: Self-pay

## 2021-06-26 ENCOUNTER — Encounter: Payer: Self-pay | Admitting: Nurse Practitioner

## 2021-06-26 ENCOUNTER — Ambulatory Visit (INDEPENDENT_AMBULATORY_CARE_PROVIDER_SITE_OTHER): Payer: Self-pay | Admitting: Nurse Practitioner

## 2021-06-26 VITALS — BP 120/84 | HR 90 | Temp 97.1°F | Resp 16 | Ht 62.0 in | Wt 152.8 lb

## 2021-06-26 DIAGNOSIS — Z0000 Encounter for general adult medical examination without abnormal findings: Secondary | ICD-10-CM

## 2021-06-26 DIAGNOSIS — E663 Overweight: Secondary | ICD-10-CM

## 2021-06-26 DIAGNOSIS — Z0001 Encounter for general adult medical examination with abnormal findings: Secondary | ICD-10-CM

## 2021-06-26 DIAGNOSIS — L308 Other specified dermatitis: Secondary | ICD-10-CM

## 2021-06-26 DIAGNOSIS — Z13220 Encounter for screening for lipoid disorders: Secondary | ICD-10-CM

## 2021-06-26 DIAGNOSIS — K219 Gastro-esophageal reflux disease without esophagitis: Secondary | ICD-10-CM

## 2021-06-26 DIAGNOSIS — Z79899 Other long term (current) drug therapy: Secondary | ICD-10-CM

## 2021-06-26 DIAGNOSIS — I10 Essential (primary) hypertension: Secondary | ICD-10-CM

## 2021-06-26 DIAGNOSIS — F325 Major depressive disorder, single episode, in full remission: Secondary | ICD-10-CM

## 2021-06-26 DIAGNOSIS — C4362 Malignant melanoma of left upper limb, including shoulder: Secondary | ICD-10-CM

## 2021-06-26 LAB — COMPLETE METABOLIC PANEL WITH GFR
AG Ratio: 2 (calc) (ref 1.0–2.5)
ALT: 11 U/L (ref 6–29)
AST: 17 U/L (ref 10–35)
Albumin: 4.5 g/dL (ref 3.6–5.1)
Alkaline phosphatase (APISO): 77 U/L (ref 37–153)
BUN: 14 mg/dL (ref 7–25)
CO2: 30 mmol/L (ref 20–32)
Calcium: 9.5 mg/dL (ref 8.6–10.4)
Chloride: 105 mmol/L (ref 98–110)
Creat: 0.77 mg/dL (ref 0.50–1.05)
Globulin: 2.3 g/dL (calc) (ref 1.9–3.7)
Glucose, Bld: 79 mg/dL (ref 65–99)
Potassium: 5.1 mmol/L (ref 3.5–5.3)
Sodium: 140 mmol/L (ref 135–146)
Total Bilirubin: 0.6 mg/dL (ref 0.2–1.2)
Total Protein: 6.8 g/dL (ref 6.1–8.1)
eGFR: 88 mL/min/{1.73_m2} (ref >=60)

## 2021-06-26 LAB — CBC WITH DIFFERENTIAL/PLATELET
Absolute Monocytes: 418 cells/uL (ref 200–950)
Basophils Absolute: 50 cells/uL (ref 0–200)
Basophils Relative: 0.9 %
Eosinophils Absolute: 132 cells/uL (ref 15–500)
Eosinophils Relative: 2.4 %
HCT: 44.3 % (ref 35.0–45.0)
Hemoglobin: 15.1 g/dL (ref 11.7–15.5)
Lymphs Abs: 1925 cells/uL (ref 850–3900)
MCH: 31.7 pg (ref 27.0–33.0)
MCHC: 34.1 g/dL (ref 32.0–36.0)
MCV: 92.9 fL (ref 80.0–100.0)
MPV: 9.5 fL (ref 7.5–12.5)
Monocytes Relative: 7.6 %
Neutro Abs: 2976 cells/uL (ref 1500–7800)
Neutrophils Relative %: 54.1 %
Platelets: 275 10*3/uL (ref 140–400)
RBC: 4.77 10*6/uL (ref 3.80–5.10)
RDW: 12.2 % (ref 11.0–15.0)
Total Lymphocyte: 35 %
WBC: 5.5 10*3/uL (ref 3.8–10.8)

## 2021-06-26 LAB — LIPID PANEL
Cholesterol: 219 mg/dL — ABNORMAL HIGH (ref <200)
HDL: 80 mg/dL (ref >=50)
LDL Cholesterol (Calc): 121 mg/dL (calc) — ABNORMAL HIGH
Non-HDL Cholesterol (Calc): 139 mg/dL (calc) — ABNORMAL HIGH (ref <130)
Total CHOL/HDL Ratio: 2.7 (calc) (ref <5.0)
Triglycerides: 84 mg/dL (ref <150)

## 2021-06-26 MED ORDER — TRIAMCINOLONE ACETONIDE 0.5 % EX CREA: 1.0000 "application " | TOPICAL_CREAM | Freq: Two times a day (BID) | CUTANEOUS | 2 refills | Status: DC

## 2021-06-26 NOTE — Patient Instructions (Signed)
Dr. Joya San Surgeon Address: [address], [place], [place] [address]  ~18 mi Phone: [phone]  Please call Dr. Earlie Server office and arrange for an appointment.  If you have any difficulty please let me know.   Melanoma Melanoma is a form of skin cancer that begins in the skin cells that produce pigment (melanocytes). Melanoma usually starts as a mole or growth (lesion) on the skin and can spread to other areas of the body. If found early and treated, many cases of melanoma are curable. What are the causes? The exact cause of this condition is unknown. What increases the risk? The following factors may make you more likely to develop this condition: Spending a lot of time in the sun, under a sunlamp, or in a tanning booth. Having a sunburn that blisters. The more blistering sunburns you have had, the higher your risk for melanoma becomes. Spending time in parts of the world where sunlight is intense. Living in a hot, sunny climate. Having fair skin that does not tan easily. Having had melanoma before. Having a family history of melanoma. Having many skin moles or unusual moles (dysplastic nevi). Having a weakened immune system. What are the signs or symptoms? Symptoms of this condition include: ABCDE changes in a mole. ABCDE stands for: Asymmetry. This means the mole has an irregular shape. It is not round or oval. Border. This means the mole has an irregular or bumpy border. Color. This means the mole has multiple colors in it, including brown, black, blue, red, or tan. Diameter. This means the mole is more than 0.2 in (6 mm) across. Evolving. This refers to any unusual changes or symptoms in the mole, such as pain, itching, stinging, sensitivity, or bleeding. A new mole or skin lesion. Symptoms of melanoma that may have spread to other areas of the body include: Swollen lymph nodes. Shortness of breath. Bone pain. Headache. Seizures. Visual  problems. How is this diagnosed? This condition is diagnosed by examining a tissue sample from the mole (biopsy). The biopsy will reveal whether melanoma has spread to deeper layers of the skin. You may also have other tests, including: Blood tests. Chest X-rays. Sentinel node biopsy. A CT scan. A bone scan. How is this treated? This condition is treated with surgery to remove the cancer as well as some of the tissue around where the cancer was found. Your lymph nodes may also be removed during the surgery. If melanoma has spread to other areas, such as the lymph nodes, liver, lungs, bone, or brain, you will need additional treatment, which may include: Chemotherapy. This treatment uses medicine to kill cancer cells. Radiation therapy. This treatment uses high-energy rays to kill cancer cells. Targeted therapy. This treatment uses medicines that target specific genetic mutations linked to melanoma. Biological therapy (immunotherapy). This treatment acts on the immune system to help kill cancer cells. A combination of surgery and other treatments may be recommended. Melanoma can come back after treatment (recur). Follow these instructions at home: Medicines Take over-the-counter and prescription medicines only as told by your health care provider. Talk with your health care provider before taking vitamins, supplements, and over-the-counter medicines. Some of these can interfere with chemotherapy. Eating and drinking Drink enough fluid to keep your urine pale yellow. Talk to a dietitian about what you should eat and drink during cancer treatment. If you have side effects that affect eating, it may help to: Eat smaller meals and snacks often. Drink high-nutrition and high-calorie  shakes or supplements. Eat bland and soft foods that are easy to eat. Donot eat foods that are hot, spicy, or difficult to swallow. Lifestyle  Stay out of the sun, especially during peak mid-afternoon hours. Take  the following precautions every day, even on cloudy days and in the winter, and even if you will be outdoors for only a short period of time. Wear long sleeves, a hat, and sunglasses that block UV light when possible. Regularly apply a sunscreen with an SPF of 30 or higher. Consider cosmetics that contain an SPF. Limit sun exposure in the middle part of the day. Get regular exercise, such as walking, gentle yoga, or tai chi. Do not use any products that contain nicotine or tobacco, such as cigarettes and e-cigarettes. If you need help quitting, ask your health care provider. Consider joining a support group. Ask your health care provider to recommend support groups online or in your community. General instructions Learn as much as you can about your condition. If you lose your hair, wear a wig, hat, or scarf to cover your head. Meet with a hair and skin care specialist for makeup and skin care tips. Work with your health care provider to manage side effects of treatment. Keep all follow-up visits as told by your health care provider. This is important. You will need to have regular visits during and after treatment as a part of your care. Where to find more information [place]: https://www.cancer.gov American Cancer Society: http://www.cancer.org Contact a health care provider if: You notice any new growths or changes in your skin. You have had melanoma removed and you notice a new growth in the same location. You have any new pain or new symptoms. Get help right away if: You have a seizure. You have chest pain. You have trouble breathing. You have a fever. You have bleeding that does not stop. Summary Melanoma is a form of skin cancer that begins in the skin cells that produce pigment (melanocytes). Melanoma usually starts as a mole or growth (lesion) on the skin and can spread to other areas of the body. This condition is diagnosed by examining a tissue sample from a  mole (biopsy). If found early and treated, many cases of melanoma are curable. A combination of surgery and other treatments may be recommended. Keep all follow-up visits as told by your health care provider. This is important. You will need to have regular visits during and after treatment as a part of your care. This information is not intended to replace advice given to you by your health care provider. Make sure you discuss any questions you have with your health care provider. Document Revised: 08/06/2020 Document Reviewed: 08/08/2020 Elsevier Patient Education  2022 [employer].

## 2021-07-11 ENCOUNTER — Other Ambulatory Visit: Payer: Self-pay | Admitting: Adult Health

## 2021-07-11 DIAGNOSIS — F419 Anxiety disorder, unspecified: Secondary | ICD-10-CM

## 2021-08-01 ENCOUNTER — Ambulatory Visit: Payer: Self-pay | Admitting: Internal Medicine

## 2021-08-16 ENCOUNTER — Other Ambulatory Visit: Payer: Self-pay | Admitting: Nurse Practitioner

## 2021-08-16 DIAGNOSIS — F419 Anxiety disorder, unspecified: Secondary | ICD-10-CM

## 2021-08-16 MED ORDER — ALPRAZOLAM 0.5 MG PO TABS: ORAL_TABLET | ORAL | 0 refills | Status: DC

## 2021-09-12 ENCOUNTER — Other Ambulatory Visit: Payer: Self-pay | Admitting: Nurse Practitioner

## 2021-09-12 DIAGNOSIS — F419 Anxiety disorder, unspecified: Secondary | ICD-10-CM

## 2021-09-17 ENCOUNTER — Other Ambulatory Visit: Payer: Self-pay | Admitting: Nurse Practitioner

## 2021-09-17 DIAGNOSIS — F419 Anxiety disorder, unspecified: Secondary | ICD-10-CM

## 2021-10-16 ENCOUNTER — Other Ambulatory Visit: Payer: Self-pay | Admitting: Nurse Practitioner

## 2021-10-16 DIAGNOSIS — F419 Anxiety disorder, unspecified: Secondary | ICD-10-CM

## 2021-10-16 NOTE — Telephone Encounter (Signed)
Please schedule a 6 month follow up for February

## 2021-11-14 ENCOUNTER — Other Ambulatory Visit: Payer: Self-pay | Admitting: Nurse Practitioner

## 2021-11-14 DIAGNOSIS — F419 Anxiety disorder, unspecified: Secondary | ICD-10-CM

## 2021-12-23 ENCOUNTER — Other Ambulatory Visit: Payer: Self-pay | Admitting: Nurse Practitioner

## 2021-12-23 DIAGNOSIS — F419 Anxiety disorder, unspecified: Secondary | ICD-10-CM

## 2022-01-22 ENCOUNTER — Other Ambulatory Visit: Payer: Self-pay | Admitting: Nurse Practitioner

## 2022-01-22 DIAGNOSIS — F419 Anxiety disorder, unspecified: Secondary | ICD-10-CM

## 2022-02-24 ENCOUNTER — Other Ambulatory Visit: Payer: Self-pay | Admitting: Nurse Practitioner

## 2022-02-24 DIAGNOSIS — F419 Anxiety disorder, unspecified: Secondary | ICD-10-CM

## 2022-03-25 ENCOUNTER — Encounter: Payer: Self-pay | Admitting: Nurse Practitioner

## 2022-03-25 ENCOUNTER — Ambulatory Visit (INDEPENDENT_AMBULATORY_CARE_PROVIDER_SITE_OTHER): Payer: Self-pay | Admitting: Nurse Practitioner

## 2022-03-25 VITALS — BP 132/70 | HR 75 | Temp 97.7°F | Wt 154.4 lb

## 2022-03-25 DIAGNOSIS — E559 Vitamin D deficiency, unspecified: Secondary | ICD-10-CM

## 2022-03-25 DIAGNOSIS — F17200 Nicotine dependence, unspecified, uncomplicated: Secondary | ICD-10-CM

## 2022-03-25 DIAGNOSIS — C4362 Malignant melanoma of left upper limb, including shoulder: Secondary | ICD-10-CM

## 2022-03-25 DIAGNOSIS — E785 Hyperlipidemia, unspecified: Secondary | ICD-10-CM

## 2022-03-25 DIAGNOSIS — R7309 Other abnormal glucose: Secondary | ICD-10-CM

## 2022-03-25 DIAGNOSIS — F325 Major depressive disorder, single episode, in full remission: Secondary | ICD-10-CM

## 2022-03-25 DIAGNOSIS — R911 Solitary pulmonary nodule: Secondary | ICD-10-CM

## 2022-03-25 DIAGNOSIS — I10 Essential (primary) hypertension: Secondary | ICD-10-CM

## 2022-03-25 NOTE — Progress Notes (Signed)
FOLLOW UP Assessment and Plan:   Essential hypertension - currently well controlled off of meds, continue DASH diet, exercise and monitor at home. Call if greater than 130/80.   Depression, major, in remission ([place]) Currently well controlled; discussed avoiding daily use of benzo continue medications, stress management techniques discussed, increase water, good sleep hygiene discussed, increase exercise, and increase veggies.    Tobacco use disorder -  instruction/counseling given, counseled patient on the dangers of tobacco use, advised patient to stop smoking, and reviewed strategies to maximize success, patient not ready to quit at this time but has cut back   Hyperlipidemia -decrease fatty foods, increase activity - last check was well controlled; financial burden with uninsured and melanoma workup more urgent- defer checking until next OV  Vitamin D deficiency Continue supplement;   Malignant melanoma of wrist, left ([place]) See extensive discussion; needs follow up, She refuses any further follow up Left wrist appears in good order, no palpable lymph nodes, denies resp/lung sx today  Lifestyle reviewed; reduce sstress increase water, good sleep hygiene discussed, increase exercise, and increase fresh fruits/veggies, STOP SMOKING Get on bASA daily   Abnormal Glucose Continue diet and exercise Most recent glucose have been at goal- defer A1c due to cost - Check CMP  Pulmonary Nodule Again reviewed PET scan results which showed pulmonary nodules Pt refuses any further follow up at this time- counseled on risks   Discussed med's effects and SE's. Screening labs and tests as requested with regular follow-up as recommended.  Future Appointments  Date Time Provider [place]  06/26/2022  9:00 AM Magda Bernheim, NP [facility] None     HPI 61 y.o. female presents for follow up, htn, hyperlipidemia, GERD, depression/anxiety, smoker, weight.   SHE IS SELF PAY. Refuses  all labs today- wants to wait until CPE  She had growth of left wrist excised by Dr. Melford Aase on 07/06/2020, path returned melanoma, was referred to Dr. Johnathan Hausen, PET 07/24/2020 showed Multiple bilateral pulmonary nodular opacities, including a dominant 3.0 cm masslike opacity in the anterior left lower lobe. Covid 19/alternate infectious etiology wasn't ruled out, did have covid 19 test which was negative. Recommended consideration of follow up CT in 4-6 weeks. Dr. Hassell Done was planning resection and lymph node study, but lost contact with patient. Today she states felt she was doing well, wasn't sure about Dr. Hassell Done and wanted to hold off. Reviewed PET results with her.  She refuses any further follow up  Laid off 2018, hasn't found job, husband has own business, no insurance. She worked in an office for 36 years.  Depression in remission, on cymbalta AND xanax as needed 0.'5mg'$ , last filled 11/07/20, will take 1 at night for sleep, only occasionally during the day. Have cautioned against daily use, limit to <5 days/week.   BMI is Body mass index is 28.24 kg/m., she has not been working on diet and exercise. Wt Readings from Last 3 Encounters:  03/25/22 154 lb 6.4 oz (70 kg)  06/26/21 152 lb 12.8 oz (69.3 kg)  12/20/20 153 lb (69.4 kg)   She has had elevated blood pressure since 2006. Her blood pressure has been controlled at home, today their BP is BP: 132/70  BP Readings from Last 3 Encounters:  03/25/22 132/70  06/26/21 120/84  12/20/20 110/76  She does not workout. She denies chest pain, shortness of breath, dizziness.    She continues to smoke HAS CUT BACK TO LESS THAN 1/2 pack a day, some days less.  Denies desire to quit.   She is not on cholesterol medication and denies myalgias. Her cholesterol is not at goal. She has not been doing much focus on diet. The cholesterol last visit was:   Lab Results  Component Value Date   CHOL 219 (H) 06/26/2021   HDL 80 06/26/2021   LDLCALC 121  (H) 06/26/2021   TRIG 84 06/26/2021   CHOLHDL 2.7 06/26/2021   Last A1C in the office was:  Lab Results  Component Value Date   HGBA1C 5.5 05/14/2015   Patient was on Vitamin D supplement , currently not taking a supplement Lab Results  Component Value Date   VD25OH 38 05/14/2016     Current Medications:     Current Outpatient Medications (Respiratory):    albuterol (VENTOLIN HFA) 108 (90 Base) MCG/ACT inhaler, Inhale 2 puffs into the lungs every 6 (six) hours as needed for wheezing or shortness of breath.   cetirizine (ZYRTEC) 10 MG tablet, Take 10 mg by mouth daily.    Current Outpatient Medications (Other):    acyclovir (ZOVIRAX) 400 MG tablet, TAKE ONE TABLET 3 TIMES A DAY AS NEEDED.   ALPRAZolam (XANAX) 0.5 MG tablet, TAKE 1/2 TO 1 TABLET BY MOUTH TWO TIMES A DAY AS NEEDED FOR ANXIETY ATTACKS. ** DO NOT TAKE MORE THAN 5 DAYS PER WEEK ** **MUST LAST 30 DAYS**   DULoxetine (CYMBALTA) 60 MG capsule, Take  1 capsule  Daily  for Mood & Chronic Pain   OVER THE COUNTER MEDICATION, Uses OTC Flonase nasal spray PRN.   triamcinolone cream (KENALOG) 0.5 %, Apply 1 application topically 2 (two) times daily.  Problem List has Hypertension; Depression, major, in remission ([place]); Anxiety; Allergy; GERD (gastroesophageal reflux disease); Tobacco use disorder; Hyperlipidemia; Malignant melanoma of wrist, left ([place]); and Overweight (BMI 25.0-29.9) on their problem list.  Allergies Allergies  Allergen Reactions   Azithromycin Nausea And Vomiting   Prednisone     Post menopausal bleeding   Surgical History: reviewed and unchanged Family History: reviewed and unchanged Social History: reviewed and unchanged   Review of Systems  Constitutional: Negative.   HENT: Negative.    Eyes: Negative.   Respiratory: Negative.    Cardiovascular: Negative.   Gastrointestinal: Negative.   Genitourinary: Negative.   Musculoskeletal: Negative.   Skin: Negative.   Neurological: Negative.    Endo/Heme/Allergies: Negative.   Psychiatric/Behavioral: Negative.    Physical Exam: Estimated body mass index is 28.24 kg/m as calculated from the following:   Height as of 06/26/21: '5\' 2"'$  (1.575 m).   Weight as of this encounter: 154 lb 6.4 oz (70 kg). BP 132/70   Pulse 75   Temp 97.7 F (36.5 C)   Wt 154 lb 6.4 oz (70 kg)   LMP 05/30/2013   SpO2 97%   BMI 28.24 kg/m  General Appearance: Well nourished, in no apparent distress. Eyes: PERRLA, EOMs, conjunctiva no swelling or erythema, normal fundi and vessels. Sinuses: No Frontal/maxillary tenderness ENT/Mouth: Ext aud canals clear, normal light reflex with TMs without erythema, bulging.  Good dentition. No erythema, swelling, or exudate on post pharynx. Tonsils not swollen or erythematous. Hearing normal.  Neck: Supple, thyroid normal. No bruits Respiratory: Respiratory effort normal, BS equal bilaterally without rales, rhonchi, wheezing or stridor. Cardio: RRR without murmurs, rubs or gallops. Brisk peripheral pulses without edema.  Chest: symmetric, with normal excursions and percussion. Abdomen: Soft, +BS. Non tender, no guarding, rebound, hernias, masses, or organomegaly. Lymphatics: Non tender without lymphadenopathy.  Musculoskeletal: Full ROM  all peripheral extremities,5/5 strength, and normal gait. Skin: Warm, dry without rashes, lesions, ecchymosis. Well healed incision to left wrist without palpable thickening, mass.  Neuro: Cranial nerves intact, reflexes equal bilaterally. Normal muscle tone, no cerebellar symptoms. Sensation intact.  Psych: Awake and oriented X 3, normal affect, Insight and Judgment appropriate.   Jaymen Fetch W Chamara Dyck 11:15 AM

## 2022-03-27 ENCOUNTER — Other Ambulatory Visit: Payer: Self-pay | Admitting: Nurse Practitioner

## 2022-03-27 DIAGNOSIS — F325 Major depressive disorder, single episode, in full remission: Secondary | ICD-10-CM

## 2022-03-27 DIAGNOSIS — F419 Anxiety disorder, unspecified: Secondary | ICD-10-CM

## 2022-03-27 MED ORDER — DULOXETINE HCL 60 MG PO CPEP: ORAL_CAPSULE | ORAL | 3 refills | Status: DC

## 2022-04-28 ENCOUNTER — Other Ambulatory Visit: Payer: Self-pay | Admitting: Nurse Practitioner

## 2022-04-28 DIAGNOSIS — F419 Anxiety disorder, unspecified: Secondary | ICD-10-CM

## 2022-05-30 ENCOUNTER — Other Ambulatory Visit: Payer: Self-pay | Admitting: Nurse Practitioner

## 2022-05-30 DIAGNOSIS — F419 Anxiety disorder, unspecified: Secondary | ICD-10-CM

## 2022-06-25 NOTE — Progress Notes (Unsigned)
Complete Physical  Assessment and Plan: Sylvia Mclean was seen today for annual exam.  Diagnoses and all orders for this visit:  Encounter for general adult medical examination with abnormal findings  Due Annually  Pt refused EKG, PAP, mammogram and further labwork than what was ordered today due to cost- advised to check with [facility] for mammogram or mobile mammogram services  Essential hypertension - Currently controlled without medication - continue  DASH diet, exercise and monitor at home. Call if greater than 130/80.   -     CBC with Differential/Platelet -     COMPLETE METABOLIC PANEL WITH GFR  Depression, major, in remission ([place])  Continue Cymbalta Continue diet and exercise  Overweight (BMI 25.0-29.9) -     CBC with Differential/Platelet -     COMPLETE METABOLIC PANEL WITH GFR  Continue diet and exercise, strongly encouraged  Gastroesophageal reflux disease, unspecified whether esophagitis present  Symptoms well controlled, continue diet modifications  Medication management -     CBC with Differential/Platelet -     COMPLETE METABOLIC PANEL WITH GFR -     Lipid panel  Tobacco use disorder - Counseled on dangers of smoking and encouraged to quit  Malignant melanoma of left wrist ([place]) See extensive discussion; needs follow up, She refuses any further follow up Left wrist appears in good order, no palpable lymph nodes, denies resp/lung sx today  Lifestyle reviewed; reduce sstress increase water, good sleep hygiene discussed, increase exercise, and increase fresh fruits/veggies, STOP SMOKING  Hyperlipidemia Continue diet and exercise -     Lipid panel   Pulmonary Nodule Again reviewed PET scan results which showed pulmonary nodules Pt refuses any further follow up at this time- counseled on risks   Screening for hematuria/proteinuria - Routine UA with reflex microscopic  Discussed med's effects and SE's. Screening labs and tests as requested with regular  follow-up as recommended. Over 40 minutes of exam, counseling, chart review, and complex, high level critical decision making was performed this visit.   Future Appointments  Date Time Provider [place]  06/30/2023  9:00 AM Alycia Rossetti, NP [facility] None    HPI  61 y.o. female  presents for a complete physical and follow up for has Hypertension; Depression, major, in remission ([place]); Anxiety; Allergy; GERD (gastroesophageal reflux disease); Tobacco use disorder; Hyperlipidemia; Malignant melanoma of wrist, left (Sylvia Mclean); and Overweight (BMI 25.0-29.9) on their problem list..  Pt continues to smoke 1/2 ppd, is trying to cut back.  Not currently ready to completely quit.   Laid off 2018, still hasn't found job.  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 Depression in remission, on cymbalta AND xanax as needed 0.7m, last filled 06/12/21, will take 1 at night for sleep, only occasionally during the day. Continued caution against daily use, limit to <5 days/week.   She had growth of left wrist excised by Dr. MMelford Aaseon 07/06/2020, path returned melanoma, was referred to Dr. MJohnathan Hausen PET 07/24/2020 showed Multiple bilateral pulmonary nodular opacities, including a dominant 3.0 cm masslike opacity in the anterior left lower lobe. Dr. MHassell Donewas planning resection and lymph node study, but lost contact with patient. PET scan showed 15-20 small opacities. Reviewed in depth again with patient .    Her blood pressure has been controlled at home, today their BP is BP: 134/86 BP Readings from Last 3 Encounters:  06/26/22 134/86  03/25/22 132/70  06/26/21 120/84    BMI is Body mass index is 27.44 kg/m., she has been  working on diet and exercise. Wt Readings from Last 3 Encounters:  06/26/22 150 lb (68 kg)  03/25/22 154 lb 6.4 oz (70 kg)  06/26/21 152 lb 12.8 oz (69.3 kg)    She does workout. She denies chest pain, shortness of breath, dizziness.   She is not on cholesterol  medication and denies myalgias. Her cholesterol is not at goal. She has not been limiting her saturated fats. The cholesterol last visit was:   Lab Results  Component Value Date   CHOL 219 (H) 06/26/2021   HDL 80 06/26/2021   LDLCALC 121 (H) 06/26/2021   TRIG 84 06/26/2021   CHOLHDL 2.7 06/26/2021     Last A1C in the office was:  Lab Results  Component Value Date   HGBA1C 5.5 05/14/2015    Last GFR: Lab Results  Component Value Date   EGFR 88 06/26/2021      Patient is on Vitamin D supplement.   Lab Results  Component Value Date   VD25OH 38 05/14/2016      Current Medications:  Current Outpatient Medications on File Prior to Visit  Medication Sig Dispense Refill   acyclovir (ZOVIRAX) 400 MG tablet TAKE ONE TABLET 3 TIMES A DAY AS NEEDED. 30 tablet 2   albuterol (VENTOLIN HFA) 108 (90 Base) MCG/ACT inhaler Inhale 2 puffs into the lungs every 6 (six) hours as needed for wheezing or shortness of breath. 18 g 2   ALPRAZolam (XANAX) 0.5 MG tablet TAKE 1/2 TO 1 TABLET BY MOUTH TWO TIMES A DAY AS NEEDED FOR ANXIETY. DO NOT TAKE MORE THAN 5 DAYS PER WEEK. MUST LAST 30 DAYS 45 tablet 0   cetirizine (ZYRTEC) 10 MG tablet Take 10 mg by mouth daily.     DULoxetine (CYMBALTA) 60 MG capsule Take  1 capsule  Daily  for Mood & Chronic Pain 90 capsule 3   OVER THE COUNTER MEDICATION Uses OTC Flonase nasal spray PRN.     triamcinolone cream (KENALOG) 0.5 % Apply 1 application topically 2 (two) times daily. 80 g 2   VITAMIN D PO Take by mouth.     No current facility-administered medications on file prior to visit.   Allergies:  Allergies  Allergen Reactions   Azithromycin Nausea And Vomiting   Prednisone     Post menopausal bleeding   Medical History:  She has Hypertension; Depression, major, in remission ([place]); Anxiety; Allergy; GERD (gastroesophageal reflux disease); Tobacco use disorder; Hyperlipidemia; Malignant melanoma of wrist, left ([place]); and Overweight (BMI 25.0-29.9) on  their problem list. Health Maintenance:   Immunization History  Administered Date(s) Administered   Tdap 05/09/2013    Tetanus: 2014 Pneumovax:declines Prevnar 13: declines Flu vaccine:declines Zostavax:declines LMP:N/A Pap: unknown, declines MGM: Unknown, given number for mammogram where grant is in place for uninsured DEXA: declines Colonoscopy: declines EGD:  Last Dental Exam:unknown Last Eye Exam: unknown Patient Care Team: Unk Pinto, MD as PCP - General (Internal Medicine) Crista Luria, MD as Consulting Physician (Dermatology) Mosetta Anis, MD as Referring Physician (Allergy)  Surgical History:  She has a past surgical history that includes Tonsillectomy and adenoidectomy and Cesarean section. Family History:  Herfamily history includes Heart attack in her father; Hyperlipidemia in her father; Hypertension in her father. Social History:  She reports that she has been smoking cigarettes. She has a 10.00 pack-year smoking history. She has never used smokeless tobacco. She reports current alcohol use of about 6.0 standard drinks of alcohol per week. She reports that she does not use  drugs.  Review of Systems: Review of Systems  Constitutional:  Negative for chills and fever.  HENT:  Negative for congestion, hearing loss, sinus pain, sore throat and tinnitus.   Eyes:  Negative for blurred vision and double vision.  Respiratory:  Negative for cough, hemoptysis, sputum production, shortness of breath and wheezing.   Cardiovascular:  Negative for chest pain, palpitations and leg swelling.  Gastrointestinal:  Negative for abdominal pain, constipation, diarrhea, heartburn, nausea and vomiting.  Genitourinary:  Negative for dysuria and urgency.  Musculoskeletal:  Negative for back pain, falls, joint pain, myalgias and neck pain.  Skin:  Negative for rash.  Neurological:  Negative for dizziness, tingling, tremors, weakness and headaches.  Endo/Heme/Allergies:  Does not  bruise/bleed easily.  Psychiatric/Behavioral:  Negative for depression and suicidal ideas. The patient is not nervous/anxious and does not have insomnia.     Physical Exam: Estimated body mass index is 27.44 kg/m as calculated from the following:   Height as of this encounter: '5\' 2"'  (1.575 m).   Weight as of this encounter: 150 lb (68 kg). BP 134/86   Pulse 80   Temp (!) 97.5 F (36.4 C)   Ht '5\' 2"'  (1.575 m)   Wt 150 lb (68 kg)   LMP 05/30/2013   SpO2 94%   BMI 27.44 kg/m  General Appearance: Well nourished, in no apparent distress.  Eyes: PERRLA, EOMs, conjunctiva no swelling or erythema, normal fundi and vessels.  Sinuses: No Frontal/maxillary tenderness  ENT/Mouth: Ext aud canals clear, normal light reflex with TMs without erythema, bulging. Good dentition. No erythema, swelling, or exudate on post pharynx. Tonsils not swollen or erythematous. Hearing normal.  Neck: Supple, thyroid normal. No bruits  Respiratory: Respiratory effort normal, BS equal bilaterally without rales, rhonchi, wheezing or stridor.  Cardio: RRR without murmurs, rubs or gallops. Brisk peripheral pulses without edema.  Chest: symmetric, with normal excursions and percussion.  Breasts: Deferred Abdomen: Positive bowel sounds all 4 quadrants, Soft, nontender, no guarding, rebound, hernias, masses, or organomegaly.  Lymphatics: Non tender without lymphadenopathy.  Genitourinary: Deferred Musculoskeletal: Full ROM all peripheral extremities,5/5 strength, and normal gait.  Skin: Warm, dry without rashes, lesions, ecchymosis. Neuro: Cranial nerves intact, reflexes equal bilaterally. Normal muscle tone, no cerebellar symptoms. Sensation intact.  Psych: Awake and oriented X 3, normal affect, Insight and Judgment appropriate.   EKG: declines AORTA SCAN: declines  [place] 9:18 AM [facility]

## 2022-06-26 ENCOUNTER — Encounter: Payer: Self-pay | Admitting: Nurse Practitioner

## 2022-06-26 ENCOUNTER — Ambulatory Visit (INDEPENDENT_AMBULATORY_CARE_PROVIDER_SITE_OTHER): Payer: Self-pay | Admitting: Nurse Practitioner

## 2022-06-26 VITALS — BP 134/86 | HR 80 | Temp 97.5°F | Ht 62.0 in | Wt 150.0 lb

## 2022-06-26 DIAGNOSIS — Z0000 Encounter for general adult medical examination without abnormal findings: Secondary | ICD-10-CM

## 2022-06-26 DIAGNOSIS — Z1389 Encounter for screening for other disorder: Secondary | ICD-10-CM

## 2022-06-26 DIAGNOSIS — F325 Major depressive disorder, single episode, in full remission: Secondary | ICD-10-CM

## 2022-06-26 DIAGNOSIS — C4362 Malignant melanoma of left upper limb, including shoulder: Secondary | ICD-10-CM

## 2022-06-26 DIAGNOSIS — E559 Vitamin D deficiency, unspecified: Secondary | ICD-10-CM

## 2022-06-26 DIAGNOSIS — Z79899 Other long term (current) drug therapy: Secondary | ICD-10-CM

## 2022-06-26 DIAGNOSIS — R7309 Other abnormal glucose: Secondary | ICD-10-CM

## 2022-06-26 DIAGNOSIS — E785 Hyperlipidemia, unspecified: Secondary | ICD-10-CM

## 2022-06-26 DIAGNOSIS — E663 Overweight: Secondary | ICD-10-CM

## 2022-06-26 DIAGNOSIS — Z1329 Encounter for screening for other suspected endocrine disorder: Secondary | ICD-10-CM

## 2022-06-26 DIAGNOSIS — I10 Essential (primary) hypertension: Secondary | ICD-10-CM

## 2022-06-26 DIAGNOSIS — K219 Gastro-esophageal reflux disease without esophagitis: Secondary | ICD-10-CM

## 2022-06-26 DIAGNOSIS — Z0001 Encounter for general adult medical examination with abnormal findings: Secondary | ICD-10-CM

## 2022-06-26 DIAGNOSIS — Z136 Encounter for screening for cardiovascular disorders: Secondary | ICD-10-CM

## 2022-06-26 DIAGNOSIS — F17200 Nicotine dependence, unspecified, uncomplicated: Secondary | ICD-10-CM

## 2022-06-27 LAB — COMPLETE METABOLIC PANEL WITH GFR
AG Ratio: 1.9 (calc) (ref 1.0–2.5)
ALT: 11 U/L (ref 6–29)
AST: 17 U/L (ref 10–35)
Albumin: 4.4 g/dL (ref 3.6–5.1)
Alkaline phosphatase (APISO): 63 U/L (ref 37–153)
BUN: 13 mg/dL (ref 7–25)
CO2: 26 mmol/L (ref 20–32)
Calcium: 9.2 mg/dL (ref 8.6–10.4)
Chloride: 106 mmol/L (ref 98–110)
Creat: 0.8 mg/dL (ref 0.50–1.05)
Globulin: 2.3 g/dL (calc) (ref 1.9–3.7)
Glucose, Bld: 90 mg/dL (ref 65–99)
Potassium: 4.1 mmol/L (ref 3.5–5.3)
Sodium: 140 mmol/L (ref 135–146)
Total Bilirubin: 0.5 mg/dL (ref 0.2–1.2)
Total Protein: 6.7 g/dL (ref 6.1–8.1)
eGFR: 84 mL/min/{1.73_m2} (ref >=60)

## 2022-06-27 LAB — LIPID PANEL
Cholesterol: 217 mg/dL — ABNORMAL HIGH (ref <200)
HDL: 79 mg/dL (ref >=50)
LDL Cholesterol (Calc): 115 mg/dL (calc) — ABNORMAL HIGH
Non-HDL Cholesterol (Calc): 138 mg/dL (calc) — ABNORMAL HIGH (ref <130)
Total CHOL/HDL Ratio: 2.7 (calc) (ref <5.0)
Triglycerides: 115 mg/dL (ref <150)

## 2022-06-27 LAB — URINALYSIS, ROUTINE W REFLEX MICROSCOPIC
Bilirubin Urine: NEGATIVE
Glucose, UA: NEGATIVE
Hgb urine dipstick: NEGATIVE
Hyaline Cast: NONE SEEN /LPF
Ketones, ur: NEGATIVE
Nitrite: NEGATIVE
Protein, ur: NEGATIVE
Specific Gravity, Urine: 1.019 (ref 1.001–1.035)
WBC, UA: NONE SEEN /HPF (ref 0–5)
pH: 5.5 (ref 5.0–8.0)

## 2022-06-27 LAB — CBC WITH DIFFERENTIAL/PLATELET
Absolute Monocytes: 425 cells/uL (ref 200–950)
Basophils Absolute: 20 cells/uL (ref 0–200)
Basophils Relative: 0.4 %
Eosinophils Absolute: 160 cells/uL (ref 15–500)
Eosinophils Relative: 3.2 %
HCT: 42.4 % (ref 35.0–45.0)
Hemoglobin: 14.6 g/dL (ref 11.7–15.5)
Lymphs Abs: 1825 cells/uL (ref 850–3900)
MCH: 31.5 pg (ref 27.0–33.0)
MCHC: 34.4 g/dL (ref 32.0–36.0)
MCV: 91.6 fL (ref 80.0–100.0)
MPV: 9.4 fL (ref 7.5–12.5)
Monocytes Relative: 8.5 %
Neutro Abs: 2570 cells/uL (ref 1500–7800)
Neutrophils Relative %: 51.4 %
Platelets: 263 10*3/uL (ref 140–400)
RBC: 4.63 10*6/uL (ref 3.80–5.10)
RDW: 12.7 % (ref 11.0–15.0)
Total Lymphocyte: 36.5 %
WBC: 5 10*3/uL (ref 3.8–10.8)

## 2022-06-27 LAB — TSH: TSH: 1.98 mIU/L (ref 0.40–4.50)

## 2022-06-27 LAB — MICROSCOPIC MESSAGE

## 2022-07-02 ENCOUNTER — Other Ambulatory Visit: Payer: Self-pay | Admitting: Nurse Practitioner

## 2022-07-02 DIAGNOSIS — F419 Anxiety disorder, unspecified: Secondary | ICD-10-CM

## 2022-07-31 ENCOUNTER — Other Ambulatory Visit: Payer: Self-pay | Admitting: Nurse Practitioner

## 2022-07-31 DIAGNOSIS — F419 Anxiety disorder, unspecified: Secondary | ICD-10-CM

## 2022-08-29 ENCOUNTER — Other Ambulatory Visit: Payer: Self-pay | Admitting: Nurse Practitioner

## 2022-08-29 DIAGNOSIS — F419 Anxiety disorder, unspecified: Secondary | ICD-10-CM

## 2022-09-05 ENCOUNTER — Other Ambulatory Visit: Payer: Self-pay | Admitting: Nurse Practitioner

## 2022-09-05 ENCOUNTER — Telehealth: Payer: Self-pay | Admitting: Nurse Practitioner

## 2022-09-05 DIAGNOSIS — Z8619 Personal history of other infectious and parasitic diseases: Secondary | ICD-10-CM

## 2022-09-05 MED ORDER — ACYCLOVIR 400 MG PO TABS: ORAL_TABLET | ORAL | 2 refills | Status: DC

## 2022-09-05 NOTE — Telephone Encounter (Signed)
Pt called requesting a refill on Acyclovir however it is expired and hasn't been prescribed since 2021. I told her we would probably need an appt since it has been so long even if she only takes it as needed. Pt declined an appt, sending note back in case you could send it in or not.

## 2022-09-30 ENCOUNTER — Other Ambulatory Visit: Payer: Self-pay | Admitting: Nurse Practitioner

## 2022-09-30 DIAGNOSIS — F419 Anxiety disorder, unspecified: Secondary | ICD-10-CM

## 2022-11-03 ENCOUNTER — Other Ambulatory Visit: Payer: Self-pay | Admitting: Nurse Practitioner

## 2022-11-03 ENCOUNTER — Telehealth: Payer: Self-pay

## 2022-11-03 DIAGNOSIS — F419 Anxiety disorder, unspecified: Secondary | ICD-10-CM

## 2022-11-03 MED ORDER — ALPRAZOLAM 0.5 MG PO TABS: ORAL_TABLET | ORAL | 0 refills | Status: DC

## 2022-11-03 NOTE — Telephone Encounter (Signed)
Alprazolam refill request 

## 2022-12-03 ENCOUNTER — Other Ambulatory Visit: Payer: Self-pay | Admitting: Nurse Practitioner

## 2022-12-03 DIAGNOSIS — F419 Anxiety disorder, unspecified: Secondary | ICD-10-CM

## 2022-12-25 ENCOUNTER — Ambulatory Visit: Payer: Self-pay | Admitting: Nurse Practitioner

## 2022-12-26 NOTE — Progress Notes (Unsigned)
 FOLLOW UP Assessment and Plan:   Essential hypertension - currently well controlled off of meds, continue DASH diet, exercise and monitor at home. Call if greater than 130/80.  - CBC, CMP  Depression, major, in remission (HCC)/Anxiety Currently well controlled; discussed avoiding daily use of benzo continue medications, stress management techniques discussed, increase water, good sleep hygiene discussed, increase exercise, and increase veggies.    Tobacco use disorder -  instruction/counseling given, counseled patient on the dangers of tobacco use, advised patient to stop smoking, and reviewed strategies to maximize success, patient not ready to quit at this time but has cut back   Hyperlipidemia -decrease fatty foods, increase activity - lipid panel  Vitamin D deficiency Continue supplement;   Malignant melanoma of wrist, left ([place]) See extensive discussion; needs follow up, She refuses any further follow up Left wrist appears in good order, no palpable lymph nodes, denies resp/lung sx today  Lifestyle reviewed; reduce sstress increase water, good sleep hygiene discussed, increase exercise, and increase fresh fruits/veggies, STOP SMOKING Get on bASA daily   Abnormal Glucose Continue diet and exercise Most recent glucose have been at goal- defer A1c due to cost - Check CMP  Pulmonary Nodule Again reviewed PET scan results which showed pulmonary nodules Pt refuses any further follow up at this time- counseled on risks  Medication Management - CBC, CMP    Discussed med's effects and SE's. Screening labs and tests as requested with regular follow-up as recommended.  Future Appointments  Date Time Provider [place]  06/30/2023  9:00 AM Alycia Rossetti, NP [facility] None     HPI 62 y.o. female presents for follow up, htn, hyperlipidemia, GERD, depression/anxiety, smoker, weight.   SHE IS SELF PAY. Refuses all labs today- wants to wait until CPE  She had  growth of left wrist excised by Dr. Melford Aase on 07/06/2020, path returned melanoma, was referred to Dr. Johnathan Hausen, PET 07/24/2020 showed Multiple bilateral pulmonary nodular opacities, including a dominant 3.0 cm masslike opacity in the anterior left lower lobe. Covid 19/alternate infectious etiology wasn't ruled out, did have covid 19 test which was negative. Recommended consideration of follow up CT in 4-6 weeks. Dr. Hassell Done was planning resection and lymph node study, but lost contact with patient. Reviewed PET results with her.  She refuses any further follow up  Laid off 2018, hasn't found job, husband has own business, no insurance. She worked in an office for 36 years.  Depression in remission, on cymbalta AND xanax as needed 0.'5mg'$ , last filled 12/03/22 #40, will take 1 at night for sleep, only occasionally during the day. Have cautioned against daily use, limit to <5 days/week.   She continues to use Cymbalta and Xanax as needed for depression, anxiety and insomnia.  States all are well controlled.  BMI is Body mass index is 27.44 kg/m., she has not been working on diet and exercise. Wt Readings from Last 3 Encounters:  12/29/22 150 lb (68 kg)  06/26/22 150 lb (68 kg)  03/25/22 154 lb 6.4 oz (70 kg)   She has had elevated blood pressure since 2006. She does not check her BP at home, today their BP is BP: 120/78  BP Readings from Last 3 Encounters:  12/29/22 120/78  06/26/22 134/86  03/25/22 132/70  She does not workout. She denies chest pain, shortness of breath, dizziness.    She continues to smoke HAS CUT BACK TO LESS THAN 1/2 pack a day, some days less. Denies desire to quit.  She is not on cholesterol medication and denies myalgias. Her cholesterol is not at goal. She has not been doing much focus on diet. Is taking a supplement but cannot remember the name- not fish oil. The cholesterol last visit was:   Lab Results  Component Value Date   CHOL 217 (H) 06/26/2022   HDL 79  06/26/2022   LDLCALC 115 (H) 06/26/2022   TRIG 115 06/26/2022   CHOLHDL 2.7 06/26/2022   Last A1C in the office was:  Lab Results  Component Value Date   HGBA1C 5.5 05/14/2015   Patient was on Vitamin D supplement , currently not taking a supplement Lab Results  Component Value Date   VD25OH 38 05/14/2016     Current Medications:     Current Outpatient Medications (Respiratory):    cetirizine (ZYRTEC) 10 MG tablet, Take 10 mg by mouth daily.   albuterol (VENTOLIN HFA) 108 (90 Base) MCG/ACT inhaler, Inhale 2 puffs into the lungs every 6 (six) hours as needed for wheezing or shortness of breath. (Patient not taking: Reported on 12/29/2022)    Current Outpatient Medications (Other):    ALPRAZolam (XANAX) 0.5 MG tablet, TAKE 1/2 TO 1 TABLET BY MOUTH TWO TIMES A DAY AS NEEDED FOR ANXIETY .DO NOT USE MORE THAN 5 DAYS PER WEEK. MUST LAST 30 DAYS   DULoxetine (CYMBALTA) 60 MG capsule, Take  1 capsule  Daily  for Mood & Chronic Pain   OVER THE COUNTER MEDICATION, Uses OTC Flonase nasal spray PRN.   triamcinolone cream (KENALOG) 0.5 %, Apply 1 application topically 2 (two) times daily.   VITAMIN D PO, Take by mouth.   acyclovir (ZOVIRAX) 400 MG tablet, TAKE ONE TABLET 3 TIMES A DAY AS NEEDED. (Patient not taking: Reported on 12/29/2022)  Problem List has Hypertension; Depression, major, in remission ([place]); Anxiety; Allergy; GERD (gastroesophageal reflux disease); Tobacco use disorder; Hyperlipidemia; Malignant melanoma of wrist, left ([place]); and Overweight (BMI 25.0-29.9) on their problem list.  Allergies Allergies  Allergen Reactions   Azithromycin Nausea And Vomiting   Prednisone     Post menopausal bleeding   Surgical History: reviewed and unchanged Family History: reviewed and unchanged Social History: reviewed and unchanged   Review of Systems  Constitutional: Negative.  Negative for chills, fever and weight loss.  HENT: Negative.  Negative for congestion and hearing loss.    Eyes: Negative.  Negative for blurred vision and double vision.  Respiratory: Negative.  Negative for cough and shortness of breath.   Cardiovascular: Negative.  Negative for chest pain, palpitations, orthopnea and leg swelling.  Gastrointestinal: Negative.  Negative for abdominal pain, constipation, diarrhea, heartburn, nausea and vomiting.  Genitourinary: Negative.   Musculoskeletal: Negative.  Negative for falls, joint pain and myalgias.  Skin: Negative.  Negative for rash.  Neurological: Negative.  Negative for dizziness, tingling, tremors, loss of consciousness and headaches.  Endo/Heme/Allergies: Negative.   Psychiatric/Behavioral: Negative.  Negative for depression, memory loss and suicidal ideas.    Physical Exam: Estimated body mass index is 27.44 kg/m as calculated from the following:   Height as of this encounter: '5\' 2"'$  (1.575 m).   Weight as of this encounter: 150 lb (68 kg). BP 120/78   Pulse 83   Temp (!) 97.5 F (36.4 C)   Ht '5\' 2"'$  (1.575 m)   Wt 150 lb (68 kg)   LMP 05/30/2013   SpO2 96%   BMI 27.44 kg/m  General Appearance: Well nourished, in no apparent distress. Eyes: PERRLA, EOMs, conjunctiva no  swelling or erythema, normal fundi and vessels. Sinuses: No Frontal/maxillary tenderness ENT/Mouth: Ext aud canals clear, normal light reflex with TMs without erythema, bulging.  Good dentition. No erythema, swelling, or exudate on post pharynx. Tonsils not swollen or erythematous. Hearing normal.  Neck: Supple, thyroid normal. No bruits Respiratory: Respiratory effort normal, BS equal bilaterally without rales, rhonchi, wheezing or stridor. Cardio: RRR without murmurs, rubs or gallops. Brisk peripheral pulses without edema.  Chest: symmetric, with normal excursions and percussion. Abdomen: Soft, +BS. Non tender, no guarding, rebound, hernias, masses, or organomegaly. Lymphatics: Non tender without lymphadenopathy.  Musculoskeletal: Full ROM all peripheral  extremities,5/5 strength, and normal gait. Skin: Warm, dry without rashes, lesions, ecchymosis. Well healed incision to left wrist without palpable thickening, mass.  Neuro: Cranial nerves intact, reflexes equal bilaterally. Normal muscle tone, no cerebellar symptoms. Sensation intact.  Psych: Awake and oriented X 3, normal affect, Insight and Judgment appropriate.   Tarron Krolak E  11:47 AM

## 2022-12-29 ENCOUNTER — Encounter: Payer: Self-pay | Admitting: Nurse Practitioner

## 2022-12-29 ENCOUNTER — Ambulatory Visit (INDEPENDENT_AMBULATORY_CARE_PROVIDER_SITE_OTHER): Payer: Self-pay | Admitting: Nurse Practitioner

## 2022-12-29 VITALS — BP 120/78 | HR 83 | Temp 97.5°F | Ht 62.0 in | Wt 150.0 lb

## 2022-12-29 DIAGNOSIS — F419 Anxiety disorder, unspecified: Secondary | ICD-10-CM

## 2022-12-29 DIAGNOSIS — C4362 Malignant melanoma of left upper limb, including shoulder: Secondary | ICD-10-CM

## 2022-12-29 DIAGNOSIS — I10 Essential (primary) hypertension: Secondary | ICD-10-CM

## 2022-12-29 DIAGNOSIS — E559 Vitamin D deficiency, unspecified: Secondary | ICD-10-CM

## 2022-12-29 DIAGNOSIS — R911 Solitary pulmonary nodule: Secondary | ICD-10-CM

## 2022-12-29 DIAGNOSIS — R7309 Other abnormal glucose: Secondary | ICD-10-CM

## 2022-12-29 DIAGNOSIS — F325 Major depressive disorder, single episode, in full remission: Secondary | ICD-10-CM

## 2022-12-29 DIAGNOSIS — E785 Hyperlipidemia, unspecified: Secondary | ICD-10-CM

## 2022-12-29 DIAGNOSIS — F17200 Nicotine dependence, unspecified, uncomplicated: Secondary | ICD-10-CM

## 2022-12-29 DIAGNOSIS — Z79899 Other long term (current) drug therapy: Secondary | ICD-10-CM

## 2022-12-29 NOTE — Patient Instructions (Signed)

## 2022-12-30 LAB — CBC WITH DIFFERENTIAL/PLATELET
Absolute Monocytes: 418 cells/uL (ref 200–950)
Basophils Absolute: 41 cells/uL (ref 0–200)
Basophils Relative: 0.8 %
Eosinophils Absolute: 82 cells/uL (ref 15–500)
Eosinophils Relative: 1.6 %
HCT: 44 % (ref 35.0–45.0)
Hemoglobin: 15.1 g/dL (ref 11.7–15.5)
Lymphs Abs: 1867 cells/uL (ref 850–3900)
MCH: 31.3 pg (ref 27.0–33.0)
MCHC: 34.3 g/dL (ref 32.0–36.0)
MCV: 91.1 fL (ref 80.0–100.0)
MPV: 9.5 fL (ref 7.5–12.5)
Monocytes Relative: 8.2 %
Neutro Abs: 2693 cells/uL (ref 1500–7800)
Neutrophils Relative %: 52.8 %
Platelets: 288 10*3/uL (ref 140–400)
RBC: 4.83 10*6/uL (ref 3.80–5.10)
RDW: 12.6 % (ref 11.0–15.0)
Total Lymphocyte: 36.6 %
WBC: 5.1 10*3/uL (ref 3.8–10.8)

## 2022-12-30 LAB — COMPLETE METABOLIC PANEL WITH GFR
AG Ratio: 2 (calc) (ref 1.0–2.5)
ALT: 11 U/L (ref 6–29)
AST: 17 U/L (ref 10–35)
Albumin: 4.5 g/dL (ref 3.6–5.1)
Alkaline phosphatase (APISO): 76 U/L (ref 37–153)
BUN: 14 mg/dL (ref 7–25)
CO2: 26 mmol/L (ref 20–32)
Calcium: 9.4 mg/dL (ref 8.6–10.4)
Chloride: 106 mmol/L (ref 98–110)
Creat: 0.78 mg/dL (ref 0.50–1.05)
Globulin: 2.3 g/dL (calc) (ref 1.9–3.7)
Glucose, Bld: 92 mg/dL (ref 65–99)
Potassium: 5.1 mmol/L (ref 3.5–5.3)
Sodium: 141 mmol/L (ref 135–146)
Total Bilirubin: 0.3 mg/dL (ref 0.2–1.2)
Total Protein: 6.8 g/dL (ref 6.1–8.1)
eGFR: 86 mL/min/{1.73_m2} (ref >=60)

## 2022-12-30 LAB — LIPID PANEL
Cholesterol: 211 mg/dL — ABNORMAL HIGH (ref <200)
HDL: 85 mg/dL (ref >=50)
LDL Cholesterol (Calc): 107 mg/dL (calc) — ABNORMAL HIGH
Non-HDL Cholesterol (Calc): 126 mg/dL (calc) (ref <130)
Total CHOL/HDL Ratio: 2.5 (calc) (ref <5.0)
Triglycerides: 95 mg/dL (ref <150)

## 2023-01-01 ENCOUNTER — Other Ambulatory Visit: Payer: Self-pay | Admitting: Nurse Practitioner

## 2023-01-01 ENCOUNTER — Telehealth: Payer: Self-pay

## 2023-01-01 DIAGNOSIS — F419 Anxiety disorder, unspecified: Secondary | ICD-10-CM

## 2023-01-01 MED ORDER — ALPRAZOLAM 0.5 MG PO TABS: ORAL_TABLET | ORAL | 0 refills | Status: DC

## 2023-01-01 NOTE — Telephone Encounter (Signed)
Alprazolam refill request 

## 2023-01-30 ENCOUNTER — Other Ambulatory Visit: Payer: Self-pay | Admitting: Nurse Practitioner

## 2023-01-30 DIAGNOSIS — F419 Anxiety disorder, unspecified: Secondary | ICD-10-CM

## 2023-03-03 ENCOUNTER — Other Ambulatory Visit: Payer: Self-pay | Admitting: Nurse Practitioner

## 2023-03-03 DIAGNOSIS — F419 Anxiety disorder, unspecified: Secondary | ICD-10-CM

## 2023-03-06 ENCOUNTER — Telehealth: Payer: Self-pay | Admitting: Nurse Practitioner

## 2023-03-06 ENCOUNTER — Other Ambulatory Visit: Payer: Self-pay | Admitting: Nurse Practitioner

## 2023-03-06 DIAGNOSIS — F419 Anxiety disorder, unspecified: Secondary | ICD-10-CM

## 2023-03-06 MED ORDER — ALPRAZOLAM 0.5 MG PO TABS
ORAL_TABLET | ORAL | 0 refills | Status: DC
Start: 2023-03-06 — End: 2023-04-08

## 2023-03-06 NOTE — Telephone Encounter (Signed)
Patient is requesting a refill on Alprazolam to [facility] in [place]

## 2023-04-05 ENCOUNTER — Other Ambulatory Visit: Payer: Self-pay | Admitting: Nurse Practitioner

## 2023-04-05 DIAGNOSIS — F325 Major depressive disorder, single episode, in full remission: Secondary | ICD-10-CM

## 2023-04-08 ENCOUNTER — Other Ambulatory Visit: Payer: Self-pay | Admitting: Nurse Practitioner

## 2023-04-08 DIAGNOSIS — F419 Anxiety disorder, unspecified: Secondary | ICD-10-CM

## 2023-05-13 ENCOUNTER — Other Ambulatory Visit: Payer: Self-pay | Admitting: Nurse Practitioner

## 2023-05-13 DIAGNOSIS — F419 Anxiety disorder, unspecified: Secondary | ICD-10-CM

## 2023-05-13 MED ORDER — ALPRAZOLAM 0.5 MG PO TABS
ORAL_TABLET | ORAL | 0 refills | Status: DC
Start: 2023-05-13 — End: 2023-06-18

## 2023-06-17 ENCOUNTER — Other Ambulatory Visit: Payer: Self-pay | Admitting: Nurse Practitioner

## 2023-06-17 DIAGNOSIS — F419 Anxiety disorder, unspecified: Secondary | ICD-10-CM

## 2023-06-30 ENCOUNTER — Encounter: Payer: Self-pay | Admitting: Nurse Practitioner

## 2023-06-30 NOTE — Progress Notes (Deleted)
 Complete Physical  Assessment and Plan: Sylvia Mclean was seen today for annual exam.  Diagnoses and all orders for this visit:  Encounter for general adult medical examination with abnormal findings  Due Annually  Pt refused EKG, PAP, mammogram and further labwork than what was ordered today due to cost- advised to check with [facility] for mammogram or mobile mammogram services  Essential hypertension - Currently controlled without medication - continue  DASH diet, exercise and monitor at home. Call if greater than 130/80.   -     CBC with Differential/Platelet -     COMPLETE METABOLIC PANEL WITH GFR  Depression, major, in remission (HCC)  Continue Cymbalta Continue diet and exercise  Overweight (BMI 25.0-29.9) -     CBC with Differential/Platelet -     COMPLETE METABOLIC PANEL WITH GFR  Continue diet and exercise, strongly encouraged  Gastroesophageal reflux disease, unspecified whether esophagitis present  Symptoms well controlled, continue diet modifications  Medication management -     CBC with Differential/Platelet -     COMPLETE METABOLIC PANEL WITH GFR -     Lipid panel  Tobacco use disorder - Counseled on dangers of smoking and encouraged to quit  Malignant melanoma of left wrist (HCC) See extensive discussion; needs follow up, She refuses any further follow up Left wrist appears in good order, no palpable lymph nodes, denies resp/lung sx today  Lifestyle reviewed; reduce sstress increase water, good sleep hygiene discussed, increase exercise, and increase fresh fruits/veggies, STOP SMOKING  Hyperlipidemia Continue diet and exercise -     Lipid panel   Pulmonary Nodule Again reviewed PET scan results which showed pulmonary nodules Pt refuses any further follow up at this time- counseled on risks   Screening for hematuria/proteinuria - Routine UA with reflex microscopic  Discussed med's effects and SE's. Screening labs and tests as requested with regular  follow-up as recommended. Over 40 minutes of exam, counseling, chart review, and complex, high level critical decision making was performed this visit.   Future Appointments  Date Time Provider Department Center  06/30/2023  9:00 AM Raynelle Dick, NP [facility] None  06/29/2024  9:00 AM Raynelle Dick, NP [facility] None    HPI  62 y.o. female  presents for a complete physical and follow up for has Hypertension; Depression, major, in remission (HCC); Anxiety; Allergy; GERD (gastroesophageal reflux disease); Tobacco use disorder; Hyperlipidemia; Malignant melanoma of wrist, left (HCC); and Overweight (BMI 25.0-29.9) on their problem list..  Pt continues to smoke 1/2 ppd, is trying to cut back.  Not currently ready to completely quit.   Laid off 2018, still hasn't found job.  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 Depression in remission, on cymbalta AND xanax as needed 0.5mg , last filled 06/12/21, will take 1 at night for sleep, only occasionally during the day. Continued caution against daily use, limit to <5 days/week.   She had growth of left wrist excised by Dr. Oneta Rack on 07/06/2020, path returned melanoma, was referred to Dr. Luretha Murphy, PET 07/24/2020 showed Multiple bilateral pulmonary nodular opacities, including a dominant 3.0 cm masslike opacity in the anterior left lower lobe. Dr. Daphine Deutscher was planning resection and lymph node study, but lost contact with patient. PET scan showed 15-20 small opacities. Reviewed in depth again with patient .    Her blood pressure has been controlled at home, today their BP is   BP Readings from Last 3 Encounters:  12/29/22 120/78  06/26/22 134/86  03/25/22 132/70  BMI is There is no height or weight on file to calculate BMI., she has been working on diet and exercise. Wt Readings from Last 3 Encounters:  12/29/22 150 lb (68 kg)  06/26/22 150 lb (68 kg)  03/25/22 154 lb 6.4 oz (70 kg)    She does workout. She denies chest pain,  shortness of breath, dizziness.   She is not on cholesterol medication and denies myalgias. Her cholesterol is not at goal. She has not been limiting her saturated fats. The cholesterol last visit was:   Lab Results  Component Value Date   CHOL 211 (H) 12/29/2022   HDL 85 12/29/2022   LDLCALC 107 (H) 12/29/2022   TRIG 95 12/29/2022   CHOLHDL 2.5 12/29/2022     Last A1C in the office was:  Lab Results  Component Value Date   HGBA1C 5.5 05/14/2015    Last GFR: Lab Results  Component Value Date   EGFR 86 12/29/2022      Patient is on Vitamin D supplement.   Lab Results  Component Value Date   VD25OH 38 05/14/2016      Current Medications:  Current Outpatient Medications on File Prior to Visit  Medication Sig Dispense Refill   acyclovir (ZOVIRAX) 400 MG tablet TAKE ONE TABLET 3 TIMES A DAY AS NEEDED. (Patient not taking: Reported on 12/29/2022) 30 tablet 2   albuterol (VENTOLIN HFA) 108 (90 Base) MCG/ACT inhaler Inhale 2 puffs into the lungs every 6 (six) hours as needed for wheezing or shortness of breath. (Patient not taking: Reported on 12/29/2022) 18 g 2   ALPRAZolam (XANAX) 0.5 MG tablet TAKE 1/2 TO 1 TABLET BY MOUTH DAILY AS NEEDED FOR ANXIETY .DO NOT USE MORE THAN 5 DAYS A WEEK 35 tablet 0   cetirizine (ZYRTEC) 10 MG tablet Take 10 mg by mouth daily.     DULoxetine (CYMBALTA) 60 MG capsule TAKE 1 CAPSULE BY MOUTH DAILY FOR MOOD AND CHRONIC PAIN 90 capsule 3   Flaxseed, Linseed, (FLAX SEEDS PO) Take by mouth.     OVER THE COUNTER MEDICATION Uses OTC Flonase nasal spray PRN.     triamcinolone cream (KENALOG) 0.5 % Apply 1 application topically 2 (two) times daily. 80 g 2   VITAMIN D PO Take by mouth.     No current facility-administered medications on file prior to visit.   Allergies:  Allergies  Allergen Reactions   Azithromycin Nausea And Vomiting   Prednisone     Post menopausal bleeding   Medical History:  She has Hypertension; Depression, major, in remission  (HCC); Anxiety; Allergy; GERD (gastroesophageal reflux disease); Tobacco use disorder; Hyperlipidemia; Malignant melanoma of wrist, left (HCC); and Overweight (BMI 25.0-29.9) on their problem list. Health Maintenance:   Immunization History  Administered Date(s) Administered   Tdap 05/09/2013    Tetanus: 2014 Pneumovax:declines Prevnar 13: declines Flu vaccine:declines Zostavax:declines LMP:N/A Pap: unknown, declines MGM: Unknown, given number for mammogram where grant is in place for uninsured DEXA: declines Colonoscopy: declines EGD:  Last Dental Exam:unknown Last Eye Exam: unknown Patient Care Team: Lucky Cowboy, MD as PCP - General (Internal Medicine) Campbell Stall, MD as Consulting Physician (Dermatology) Sidney Ace, MD as Referring Physician (Allergy)  Surgical History:  She has a past surgical history that includes Tonsillectomy and adenoidectomy and Cesarean section. Family History:  Herfamily history includes Heart attack in her father; Hyperlipidemia in her father; Hypertension in her father. Social History:  She reports that she has been smoking cigarettes. She has a 10  pack-year smoking history. She has never used smokeless tobacco. She reports current alcohol use of about 6.0 standard drinks of alcohol per week. She reports that she does not use drugs.  Review of Systems: Review of Systems  Constitutional:  Negative for chills and fever.  HENT:  Negative for congestion, hearing loss, sinus pain, sore throat and tinnitus.   Eyes:  Negative for blurred vision and double vision.  Respiratory:  Negative for cough, hemoptysis, sputum production, shortness of breath and wheezing.   Cardiovascular:  Negative for chest pain, palpitations and leg swelling.  Gastrointestinal:  Negative for abdominal pain, constipation, diarrhea, heartburn, nausea and vomiting.  Genitourinary:  Negative for dysuria and urgency.  Musculoskeletal:  Negative for back pain, falls, joint  pain, myalgias and neck pain.  Skin:  Negative for rash.  Neurological:  Negative for dizziness, tingling, tremors, weakness and headaches.  Endo/Heme/Allergies:  Does not bruise/bleed easily.  Psychiatric/Behavioral:  Negative for depression and suicidal ideas. The patient is not nervous/anxious and does not have insomnia.     Physical Exam: Estimated body mass index is 27.44 kg/m as calculated from the following:   Height as of 12/29/22: 5\' 2"  (1.575 m).   Weight as of 12/29/22: 150 lb (68 kg). LMP 05/30/2013  General Appearance: Well nourished, in no apparent distress.  Eyes: PERRLA, EOMs, conjunctiva no swelling or erythema, normal fundi and vessels.  Sinuses: No Frontal/maxillary tenderness  ENT/Mouth: Ext aud canals clear, normal light reflex with TMs without erythema, bulging. Good dentition. No erythema, swelling, or exudate on post pharynx. Tonsils not swollen or erythematous. Hearing normal.  Neck: Supple, thyroid normal. No bruits  Respiratory: Respiratory effort normal, BS equal bilaterally without rales, rhonchi, wheezing or stridor.  Cardio: RRR without murmurs, rubs or gallops. Brisk peripheral pulses without edema.  Chest: symmetric, with normal excursions and percussion.  Breasts: Deferred Abdomen: Positive bowel sounds all 4 quadrants, Soft, nontender, no guarding, rebound, hernias, masses, or organomegaly.  Lymphatics: Non tender without lymphadenopathy.  Genitourinary: Deferred Musculoskeletal: Full ROM all peripheral extremities,5/5 strength, and normal gait.  Skin: Warm, dry without rashes, lesions, ecchymosis. Neuro: Cranial nerves intact, reflexes equal bilaterally. Normal muscle tone, no cerebellar symptoms. Sensation intact.  Psych: Awake and oriented X 3, normal affect, Insight and Judgment appropriate.   EKG: declines AORTA SCAN: declines  Maddex Garlitz E  8:20 AM [facility]

## 2023-07-14 ENCOUNTER — Other Ambulatory Visit: Payer: Self-pay | Admitting: Nurse Practitioner

## 2023-07-14 DIAGNOSIS — F419 Anxiety disorder, unspecified: Secondary | ICD-10-CM

## 2023-07-17 ENCOUNTER — Other Ambulatory Visit: Payer: Self-pay | Admitting: Nurse Practitioner

## 2023-07-17 DIAGNOSIS — F419 Anxiety disorder, unspecified: Secondary | ICD-10-CM

## 2023-07-29 NOTE — Progress Notes (Unsigned)
 Complete Physical  Assessment and Plan: Sylvia Mclean was seen today for annual exam.  Diagnoses and all orders for this visit:  Encounter for general adult medical examination with abnormal findings  Due Annually  Pt refused EKG, PAP, mammogram and further labwork than what was ordered today due to cost- advised to check with [facility] for mammogram or mobile mammogram services  Essential hypertension - Currently controlled without medication - continue  DASH diet, exercise and monitor at home. Call if greater than 130/80.   -     CBC with Differential/Platelet -     COMPLETE METABOLIC PANEL WITH GFR  Depression, major, in remission (HCC)  Continue Cymbalta Continue diet and exercise  Overweight (BMI 25.0-29.9) -     CBC with Differential/Platelet -     COMPLETE METABOLIC PANEL WITH GFR  Continue diet and exercise, strongly encouraged  Gastroesophageal reflux disease, unspecified whether esophagitis present  Symptoms well controlled, continue diet modifications  Medication management -     CBC with Differential/Platelet -     COMPLETE METABOLIC PANEL WITH GFR -     Lipid panel  Tobacco use disorder - Counseled on dangers of smoking and encouraged to quit  Malignant melanoma of left wrist (HCC) See extensive discussion; needs follow up, She refuses any further follow up Left wrist appears in good order, no palpable lymph nodes, denies resp/lung sx today  Lifestyle reviewed; reduce sstress increase water, good sleep hygiene discussed, increase exercise, and increase fresh fruits/veggies, STOP SMOKING  Hyperlipidemia Continue diet and exercise -     Lipid panel   Pulmonary Nodule Again reviewed PET scan results which showed pulmonary nodules Pt refuses any further follow up at this time- counseled on risks   Screening for hematuria/proteinuria - Routine UA with reflex microscopic  Discussed med's effects and SE's. Screening labs and tests as requested with regular  follow-up as recommended. Over 40 minutes of exam, counseling, chart review, and complex, high level critical decision making was performed this visit.   Future Appointments  Date Time Provider Department Center  07/30/2023 10:00 AM Raynelle Dick, NP [facility] None  07/29/2024 10:00 AM Raynelle Dick, NP [facility] None    HPI  62 y.o. female  presents for a complete physical and follow up for has Hypertension; Depression, major, in remission (HCC); Anxiety; Allergy; GERD (gastroesophageal reflux disease); Tobacco use disorder; Hyperlipidemia; Malignant melanoma of wrist, left (HCC); and Overweight (BMI 25.0-29.9) on their problem list..  Pt continues to smoke 1/2 ppd, is trying to cut back.  Not currently ready to completely quit.   Laid off 2018, still hasn't found job.  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 Depression in remission, on cymbalta AND xanax as needed 0.5mg , last filled 06/12/21, will take 1 at night for sleep, only occasionally during the day. Continued caution against daily use, limit to <5 days/week.   She had growth of left wrist excised by Dr. Oneta Rack on 07/06/2020, path returned melanoma, was referred to Dr. Luretha Murphy, PET 07/24/2020 showed Multiple bilateral pulmonary nodular opacities, including a dominant 3.0 cm masslike opacity in the anterior left lower lobe. Dr. Daphine Deutscher was planning resection and lymph node study, but lost contact with patient. PET scan showed 15-20 small opacities. Reviewed in depth again with patient .    Her blood pressure has been controlled at home, today their BP is   BP Readings from Last 3 Encounters:  12/29/22 120/78  06/26/22 134/86  03/25/22 132/70    BMI is  There is no height or weight on file to calculate BMI., she has been working on diet and exercise. Wt Readings from Last 3 Encounters:  12/29/22 150 lb (68 kg)  06/26/22 150 lb (68 kg)  03/25/22 154 lb 6.4 oz (70 kg)    She does workout. She denies chest pain,  shortness of breath, dizziness.   She is not on cholesterol medication and denies myalgias. Her cholesterol is not at goal. She has not been limiting her saturated fats. The cholesterol last visit was:   Lab Results  Component Value Date   CHOL 211 (H) 12/29/2022   HDL 85 12/29/2022   LDLCALC 107 (H) 12/29/2022   TRIG 95 12/29/2022   CHOLHDL 2.5 12/29/2022     Last A1C in the office was:  Lab Results  Component Value Date   HGBA1C 5.5 05/14/2015    Last GFR: Lab Results  Component Value Date   EGFR 86 12/29/2022      Patient is on Vitamin D supplement.   Lab Results  Component Value Date   VD25OH 38 05/14/2016      Current Medications:  Current Outpatient Medications on File Prior to Visit  Medication Sig Dispense Refill   acyclovir (ZOVIRAX) 400 MG tablet TAKE ONE TABLET 3 TIMES A DAY AS NEEDED. (Patient not taking: Reported on 12/29/2022) 30 tablet 2   albuterol (VENTOLIN HFA) 108 (90 Base) MCG/ACT inhaler Inhale 2 puffs into the lungs every 6 (six) hours as needed for wheezing or shortness of breath. (Patient not taking: Reported on 12/29/2022) 18 g 2   ALPRAZolam (XANAX) 0.5 MG tablet TAKE 1/2 TO 1 TABLET BY MOUTH DAILY AS NEEDED . DO NOT USE MORE THAN 5 DAYS A WEEK. 30 tablet 0   cetirizine (ZYRTEC) 10 MG tablet Take 10 mg by mouth daily.     DULoxetine (CYMBALTA) 60 MG capsule TAKE 1 CAPSULE BY MOUTH DAILY FOR MOOD AND CHRONIC PAIN 90 capsule 3   Flaxseed, Linseed, (FLAX SEEDS PO) Take by mouth.     OVER THE COUNTER MEDICATION Uses OTC Flonase nasal spray PRN.     triamcinolone cream (KENALOG) 0.5 % Apply 1 application topically 2 (two) times daily. 80 g 2   VITAMIN D PO Take by mouth.     No current facility-administered medications on file prior to visit.   Allergies:  Allergies  Allergen Reactions   Azithromycin Nausea And Vomiting   Prednisone     Post menopausal bleeding   Medical History:  She has Hypertension; Depression, major, in remission (HCC);  Anxiety; Allergy; GERD (gastroesophageal reflux disease); Tobacco use disorder; Hyperlipidemia; Malignant melanoma of wrist, left (HCC); and Overweight (BMI 25.0-29.9) on their problem list. Health Maintenance:   Immunization History  Administered Date(s) Administered   Tdap 05/09/2013    Tetanus: 2014 Pneumovax:declines Prevnar 13: declines Flu vaccine:declines Zostavax:declines LMP:N/A Pap: unknown, declines MGM: Unknown, given number for mammogram where grant is in place for uninsured DEXA: declines Colonoscopy: declines EGD:  Last Dental Exam:unknown Last Eye Exam: unknown Patient Care Team: Lucky Cowboy, MD as PCP - General (Internal Medicine) Campbell Stall, MD as Consulting Physician (Dermatology) Sidney Ace, MD as Referring Physician (Allergy)  Surgical History:  She has a past surgical history that includes Tonsillectomy and adenoidectomy and Cesarean section. Family History:  Herfamily history includes Heart attack in her father; Hyperlipidemia in her father; Hypertension in her father. Social History:  She reports that she has been smoking cigarettes. She has a 10 pack-year smoking history.  She has never used smokeless tobacco. She reports current alcohol use of about 6.0 standard drinks of alcohol per week. She reports that she does not use drugs.  Review of Systems: Review of Systems  Constitutional:  Negative for chills and fever.  HENT:  Negative for congestion, hearing loss, sinus pain, sore throat and tinnitus.   Eyes:  Negative for blurred vision and double vision.  Respiratory:  Negative for cough, hemoptysis, sputum production, shortness of breath and wheezing.   Cardiovascular:  Negative for chest pain, palpitations and leg swelling.  Gastrointestinal:  Negative for abdominal pain, constipation, diarrhea, heartburn, nausea and vomiting.  Genitourinary:  Negative for dysuria and urgency.  Musculoskeletal:  Negative for back pain, falls, joint pain,  myalgias and neck pain.  Skin:  Negative for rash.  Neurological:  Negative for dizziness, tingling, tremors, weakness and headaches.  Endo/Heme/Allergies:  Does not bruise/bleed easily.  Psychiatric/Behavioral:  Negative for depression and suicidal ideas. The patient is not nervous/anxious and does not have insomnia.     Physical Exam: Estimated body mass index is 27.44 kg/m as calculated from the following:   Height as of 12/29/22: 5\' 2"  (1.575 m).   Weight as of 12/29/22: 150 lb (68 kg). LMP 05/30/2013  General Appearance: Well nourished, in no apparent distress.  Eyes: PERRLA, EOMs, conjunctiva no swelling or erythema, normal fundi and vessels.  Sinuses: No Frontal/maxillary tenderness  ENT/Mouth: Ext aud canals clear, normal light reflex with TMs without erythema, bulging. Good dentition. No erythema, swelling, or exudate on post pharynx. Tonsils not swollen or erythematous. Hearing normal.  Neck: Supple, thyroid normal. No bruits  Respiratory: Respiratory effort normal, BS equal bilaterally without rales, rhonchi, wheezing or stridor.  Cardio: RRR without murmurs, rubs or gallops. Brisk peripheral pulses without edema.  Chest: symmetric, with normal excursions and percussion.  Breasts: Deferred Abdomen: Positive bowel sounds all 4 quadrants, Soft, nontender, no guarding, rebound, hernias, masses, or organomegaly.  Lymphatics: Non tender without lymphadenopathy.  Genitourinary: Deferred Musculoskeletal: Full ROM all peripheral extremities,5/5 strength, and normal gait.  Skin: Warm, dry without rashes, lesions, ecchymosis. Neuro: Cranial nerves intact, reflexes equal bilaterally. Normal muscle tone, no cerebellar symptoms. Sensation intact.  Psych: Awake and oriented X 3, normal affect, Insight and Judgment appropriate.   EKG: declines AORTA SCAN: declines  Nieko Clarin E  3:16 PM [facility]

## 2023-07-30 ENCOUNTER — Encounter: Payer: Self-pay | Admitting: Nurse Practitioner

## 2023-07-30 ENCOUNTER — Ambulatory Visit (INDEPENDENT_AMBULATORY_CARE_PROVIDER_SITE_OTHER): Payer: Self-pay | Admitting: Nurse Practitioner

## 2023-07-30 VITALS — BP 138/88 | HR 99 | Temp 97.7°F | Ht 61.5 in | Wt 152.4 lb

## 2023-07-30 DIAGNOSIS — F325 Major depressive disorder, single episode, in full remission: Secondary | ICD-10-CM

## 2023-07-30 DIAGNOSIS — Z136 Encounter for screening for cardiovascular disorders: Secondary | ICD-10-CM

## 2023-07-30 DIAGNOSIS — C4362 Malignant melanoma of left upper limb, including shoulder: Secondary | ICD-10-CM

## 2023-07-30 DIAGNOSIS — E785 Hyperlipidemia, unspecified: Secondary | ICD-10-CM

## 2023-07-30 DIAGNOSIS — Z0001 Encounter for general adult medical examination with abnormal findings: Secondary | ICD-10-CM

## 2023-07-30 DIAGNOSIS — R7309 Other abnormal glucose: Secondary | ICD-10-CM

## 2023-07-30 DIAGNOSIS — R911 Solitary pulmonary nodule: Secondary | ICD-10-CM

## 2023-07-30 DIAGNOSIS — K219 Gastro-esophageal reflux disease without esophagitis: Secondary | ICD-10-CM

## 2023-07-30 DIAGNOSIS — Z1329 Encounter for screening for other suspected endocrine disorder: Secondary | ICD-10-CM

## 2023-07-30 DIAGNOSIS — Z79899 Other long term (current) drug therapy: Secondary | ICD-10-CM

## 2023-07-30 DIAGNOSIS — E663 Overweight: Secondary | ICD-10-CM

## 2023-07-30 DIAGNOSIS — Z0000 Encounter for general adult medical examination without abnormal findings: Secondary | ICD-10-CM

## 2023-07-30 DIAGNOSIS — Z1389 Encounter for screening for other disorder: Secondary | ICD-10-CM

## 2023-07-30 DIAGNOSIS — E559 Vitamin D deficiency, unspecified: Secondary | ICD-10-CM

## 2023-07-30 DIAGNOSIS — F419 Anxiety disorder, unspecified: Secondary | ICD-10-CM

## 2023-07-30 DIAGNOSIS — F17200 Nicotine dependence, unspecified, uncomplicated: Secondary | ICD-10-CM

## 2023-07-30 DIAGNOSIS — I10 Essential (primary) hypertension: Secondary | ICD-10-CM

## 2023-07-30 NOTE — Patient Instructions (Signed)

## 2023-07-31 LAB — CBC WITH DIFFERENTIAL/PLATELET
Absolute Monocytes: 425 {cells}/uL (ref 200–950)
Basophils Absolute: 41 {cells}/uL (ref 0–200)
Basophils Relative: 0.7 %
Eosinophils Absolute: 100 {cells}/uL (ref 15–500)
Eosinophils Relative: 1.7 %
HCT: 45.6 % — ABNORMAL HIGH (ref 35.0–45.0)
Hemoglobin: 15.2 g/dL (ref 11.7–15.5)
Lymphs Abs: 1788 {cells}/uL (ref 850–3900)
MCH: 31.7 pg (ref 27.0–33.0)
MCHC: 33.3 g/dL (ref 32.0–36.0)
MCV: 95 fL (ref 80.0–100.0)
MPV: 9.4 fL (ref 7.5–12.5)
Monocytes Relative: 7.2 %
Neutro Abs: 3546 {cells}/uL (ref 1500–7800)
Neutrophils Relative %: 60.1 %
Platelets: 300 10*3/uL (ref 140–400)
RBC: 4.8 10*6/uL (ref 3.80–5.10)
RDW: 13 % (ref 11.0–15.0)
Total Lymphocyte: 30.3 %
WBC: 5.9 10*3/uL (ref 3.8–10.8)

## 2023-07-31 LAB — LIPID PANEL
Cholesterol: 223 mg/dL — ABNORMAL HIGH (ref <200)
HDL: 79 mg/dL (ref >=50)
LDL Cholesterol (Calc): 128 mg/dL — ABNORMAL HIGH
Non-HDL Cholesterol (Calc): 144 mg/dL — ABNORMAL HIGH (ref <130)
Total CHOL/HDL Ratio: 2.8 (calc) (ref <5.0)
Triglycerides: 72 mg/dL (ref <150)

## 2023-07-31 LAB — COMPLETE METABOLIC PANEL WITH GFR
AG Ratio: 2 (calc) (ref 1.0–2.5)
ALT: 11 U/L (ref 6–29)
AST: 16 U/L (ref 10–35)
Albumin: 4.5 g/dL (ref 3.6–5.1)
Alkaline phosphatase (APISO): 73 U/L (ref 37–153)
BUN: 19 mg/dL (ref 7–25)
CO2: 28 mmol/L (ref 20–32)
Calcium: 9.3 mg/dL (ref 8.6–10.4)
Chloride: 104 mmol/L (ref 98–110)
Creat: 0.7 mg/dL (ref 0.50–1.05)
Globulin: 2.3 g/dL (ref 1.9–3.7)
Glucose, Bld: 82 mg/dL (ref 65–99)
Potassium: 5.4 mmol/L — ABNORMAL HIGH (ref 3.5–5.3)
Sodium: 140 mmol/L (ref 135–146)
Total Bilirubin: 0.4 mg/dL (ref 0.2–1.2)
Total Protein: 6.8 g/dL (ref 6.1–8.1)
eGFR: 98 mL/min/{1.73_m2} (ref >=60)

## 2023-07-31 LAB — URINALYSIS, ROUTINE W REFLEX MICROSCOPIC
Bilirubin Urine: NEGATIVE
Glucose, UA: NEGATIVE
Hgb urine dipstick: NEGATIVE
Nitrite: NEGATIVE
Specific Gravity, Urine: 1.024 (ref 1.001–1.035)
pH: 6 (ref 5.0–8.0)

## 2023-07-31 LAB — TSH: TSH: 1.62 m[IU]/L (ref 0.40–4.50)

## 2023-07-31 LAB — MICROALBUMIN / CREATININE URINE RATIO
Creatinine, Urine: 222 mg/dL (ref 20–275)
Microalb Creat Ratio: 5 mg/g{creat} (ref <30)
Microalb, Ur: 1 mg/dL

## 2023-09-23 ENCOUNTER — Other Ambulatory Visit: Payer: Self-pay | Admitting: Nurse Practitioner

## 2023-09-23 DIAGNOSIS — F419 Anxiety disorder, unspecified: Secondary | ICD-10-CM

## 2023-09-23 MED ORDER — ALPRAZOLAM 0.5 MG PO TABS: ORAL_TABLET | ORAL | 0 refills | Status: DC

## 2023-11-05 ENCOUNTER — Other Ambulatory Visit: Payer: Self-pay | Admitting: Nurse Practitioner

## 2023-11-05 DIAGNOSIS — F419 Anxiety disorder, unspecified: Secondary | ICD-10-CM

## 2023-12-16 ENCOUNTER — Other Ambulatory Visit: Payer: Self-pay

## 2023-12-16 DIAGNOSIS — F419 Anxiety disorder, unspecified: Secondary | ICD-10-CM

## 2023-12-17 MED ORDER — ALPRAZOLAM 0.5 MG PO TABS: ORAL_TABLET | ORAL | 0 refills | Status: DC

## 2024-02-01 ENCOUNTER — Ambulatory Visit: Payer: Self-pay | Admitting: Nurse Practitioner

## 2024-06-28 ENCOUNTER — Other Ambulatory Visit: Payer: Self-pay | Admitting: Nurse Practitioner

## 2024-06-28 DIAGNOSIS — F325 Major depressive disorder, single episode, in full remission: Secondary | ICD-10-CM

## 2024-06-29 ENCOUNTER — Encounter: Payer: Self-pay | Admitting: Nurse Practitioner

## 2024-07-29 ENCOUNTER — Encounter: Payer: Self-pay | Admitting: Nurse Practitioner

## 2024-11-17 ENCOUNTER — Ambulatory Visit: Payer: Self-pay | Admitting: Family Medicine

## 2024-11-17 ENCOUNTER — Encounter: Payer: Self-pay | Admitting: Family Medicine

## 2024-11-17 VITALS — BP 128/84 | HR 86 | Resp 14 | Ht 61.0 in | Wt 157.5 lb

## 2024-11-17 DIAGNOSIS — E782 Mixed hyperlipidemia: Secondary | ICD-10-CM

## 2024-11-17 DIAGNOSIS — Z85820 Personal history of malignant melanoma of skin: Secondary | ICD-10-CM

## 2024-11-17 DIAGNOSIS — F325 Major depressive disorder, single episode, in full remission: Secondary | ICD-10-CM

## 2024-11-17 DIAGNOSIS — F17200 Nicotine dependence, unspecified, uncomplicated: Secondary | ICD-10-CM

## 2024-11-17 DIAGNOSIS — L821 Other seborrheic keratosis: Secondary | ICD-10-CM

## 2024-11-17 DIAGNOSIS — E663 Overweight: Secondary | ICD-10-CM

## 2024-11-17 DIAGNOSIS — Z8619 Personal history of other infectious and parasitic diseases: Secondary | ICD-10-CM

## 2024-11-17 DIAGNOSIS — I10 Essential (primary) hypertension: Secondary | ICD-10-CM

## 2024-11-17 DIAGNOSIS — J3089 Other allergic rhinitis: Secondary | ICD-10-CM

## 2024-11-17 DIAGNOSIS — F411 Generalized anxiety disorder: Secondary | ICD-10-CM

## 2024-11-17 MED ORDER — DULOXETINE HCL 60 MG PO CPEP: ORAL_CAPSULE | ORAL | 3 refills | Status: AC

## 2024-11-17 MED ORDER — TRIAMCINOLONE ACETONIDE 0.5 % EX CREA: 1.0000 | TOPICAL_CREAM | Freq: Two times a day (BID) | CUTANEOUS | 2 refills | Status: AC

## 2024-11-17 MED ORDER — TRAZODONE HCL 50 MG PO TABS: 25.0000 mg | ORAL_TABLET | Freq: Every evening | ORAL | 3 refills | Status: AC | PRN

## 2024-11-17 MED ORDER — ACYCLOVIR 400 MG PO TABS: ORAL_TABLET | ORAL | 2 refills | Status: AC

## 2024-11-17 NOTE — Assessment & Plan Note (Signed)
 Major depressive disorder is in remission. Depression was more prominent during employment, likely exacerbated by work stress. - Continue current management with Cymbalta 

## 2024-11-17 NOTE — Assessment & Plan Note (Signed)
 She smokes half a pack per day for 20 years. Expresses interest in quitting but is not ready to use medication. Discussed nicotine replacement options and the importance of readiness to quit. - Discussed nicotine replacement options such as patches, gum, and lozenges

## 2024-11-17 NOTE — Assessment & Plan Note (Signed)
 Malignant melanoma with previous excision. Regular self-monitoring of moles is advised. No current dermatology follow-up due to lack of insurance. - Advised regular self-monitoring of moles - Recommended wearing sunscreen and protective clothing

## 2024-11-17 NOTE — Assessment & Plan Note (Signed)
 Well-managed with Cymbalta . She reports improvement in anxiety symptoms since discontinuing work and using Cymbalta . Occasional use of husband's Xanax  for sleep issues, but advised against long-term use due to dementia risk. - Continue Cymbalta  60 mg daily - Prescribed trazodone  50 mg for sleep as needed, starting with half a tablet

## 2024-11-17 NOTE — Assessment & Plan Note (Signed)
 Well-controlled, likely due to reduced stress after leaving employment. Previously on medication, but discontinued due to improved blood pressure readings.

## 2024-11-17 NOTE — Assessment & Plan Note (Signed)
 Previous labs showed elevated cholesterol levels. Advised to avoid fried foods and red meats, and increase fiber and plant-based foods. Exercise is recommended but challenging due to winter weather. - Advised dietary modifications to reduce cholesterol - Encouraged regular exercise as feasible

## 2024-11-17 NOTE — Assessment & Plan Note (Signed)
 Chronic and stable Continue OTC Zyrtec

## 2024-11-17 NOTE — Assessment & Plan Note (Signed)
 She is slightly overweight. Weight was stable after leaving employment, but acknowledges difficulty with exercise in winter weather. - Encouraged dietary modifications and exercise as feasible

## 2024-11-17 NOTE — Progress Notes (Signed)
 "   New patient visit   Patient: Sylvia Mclean   DOB: [birth year]   64 y.o. Female  MRN: [id number] Visit Date: 11/17/2024  Today's healthcare provider: Jon Eva, MD   Chief Complaint  Patient presents with   New Patient (Initial Visit)    No concerns  Needs rx refill Discuss xanax  Pt does not have insurance, awaiting for medicaid  Last mammogram 2018   Subjective    Sylvia Mclean is a 64 y.o. female who presents today as a new patient to establish care.   Discussed the use of AI scribe software for clinical note transcription with the patient, who gave verbal consent to proceed.  History of Present Illness   Sylvia Mclean is a 64 year old female who presents for medication refills and management of anxiety.  She takes Cymbalta  for anxiety and had difficulty when she tried to stop it abruptly. She asks for a refill and inquires about Xanax , which she used in the past for anxiety attacks including one severe episode that led to an emergency room visit. She currently sometimes takes Xanax  from her husband's supply at night for racing thoughts and sleep.  She has anxiety and depression, with anxiety now predominant. Her depression improved after she was laid off from her stressful job. She uses breathing and self-calming techniques to manage anxiety.  She had high blood pressure that improved after job loss, and she stopped her blood pressure medication. She had melanoma and is now concerned about a mole under her bra strap that is dark, large, and rough.  She has airborne allergies and takes Zyrtec daily. She uses triamcinolone  for ear symptoms and acyclovir  as needed for cold sores.  She smokes half a pack per day for about 20 years and is interested in quitting but is hesitant to use medications due to prior adverse reactions to over-the-counter drugs. She drinks beer socially. She is married, has two children (one deceased), and has adopted her grandson.          11/17/2024   10:54 AM  GAD 7 : Generalized Anxiety Score  Nervous, Anxious, on Edge 0  Control/stop worrying 0  Worry too much - different things 0  Trouble relaxing 0  Restless 0  Easily annoyed or irritable 0  Afraid - awful might happen 0  Total GAD 7 Score 0  Anxiety Difficulty Not difficult at all        11/17/2024   10:44 AM 08/02/2018    8:58 PM  Depression screen PHQ 2/9  Decreased Interest 0 0  Down, Depressed, Hopeless 0 0  PHQ - 2 Score 0 0  Altered sleeping 2   Tired, decreased energy 1   Change in appetite 0   Feeling bad or failure about yourself  0   Trouble concentrating 0   Moving slowly or fidgety/restless 0   Suicidal thoughts 0   PHQ-9 Score 3   Difficult doing work/chores Not difficult at all       Past Medical History:  Diagnosis Date   Allergy    Anxiety    Depression    Hypertension 10/27/2004   Melanoma (HCC)    Past Surgical History:  Procedure Laterality Date   CESAREAN SECTION     x2   MELANOMA EXCISION     TONSILLECTOMY AND ADENOIDECTOMY     Family Status  Relation Name Status   Father  Deceased   MGF  (Not Specified)   Neg Hx  (  Not Specified)  No partnership data on file   Family History  Problem Relation Age of Onset   Hypertension Father    Hyperlipidemia Father    Heart attack Father    Diabetes Maternal Grandfather    Colon cancer Neg Hx    Breast cancer Neg Hx    Social History   Socioeconomic History   Marital status: Married    Spouse name: Not on file   Number of children: 2   Years of education: Not on file   Highest education level: Not on file  Occupational History   Not on file  Tobacco Use   Smoking status: Every Day    Current packs/day: 0.50    Average packs/day: 0.5 packs/day for 20.0 years (10.0 ttl pk-yrs)    Types: Cigarettes   Smokeless tobacco: Never  Vaping Use   Vaping status: Never Used  Substance and Sexual Activity   Alcohol use: Yes    Alcohol/week: 2.0 standard drinks of  alcohol    Types: 2 Standard drinks or equivalent per week    Comment: socially, not every week   Drug use: No   Sexual activity: Yes    Partners: Male    Birth control/protection: Post-menopausal  Other Topics Concern   Not on file  Social History Narrative   Not on file   Social Drivers of Health   Tobacco Use: High Risk (11/17/2024)   Patient History    Smoking Tobacco Use: Every Day    Smokeless Tobacco Use: Never    Passive Exposure: Not on file  Financial Resource Strain: Low Risk (11/17/2024)   Overall Financial Resource Strain (CARDIA)    Difficulty of Paying Living Expenses: Not hard at all  Food Insecurity: No Food Insecurity (11/17/2024)   Epic    Worried About Radiation Protection Practitioner of Food in the Last Year: Never true    Ran Out of Food in the Last Year: Never true  Transportation Needs: No Transportation Needs (11/17/2024)   Epic    Lack of Transportation (Medical): No    Lack of Transportation (Non-Medical): No  Physical Activity: Inactive (11/17/2024)   Exercise Vital Sign    Days of Exercise per Week: 0 days    Minutes of Exercise per Session: 0 min  Stress: No Stress Concern Present (11/17/2024)   [employer] of Occupational Health - Occupational Stress Questionnaire    Feeling of Stress: Not at all  Social Connections: Not on file  Depression (PHQ2-9): Low Risk (11/17/2024)   Depression (PHQ2-9)    PHQ-2 Score: 3  Alcohol Screen: Low Risk (11/17/2024)   Alcohol Screen    Last Alcohol Screening Score (AUDIT): 1  Housing: Low Risk (11/17/2024)   Epic    Unable to Pay for Housing in the Last Year: No    Number of Times Moved in the Last Year: 0    Homeless in the Last Year: No  Utilities: Not At Risk (11/17/2024)   Epic    Threatened with loss of utilities: No  Health Literacy: Adequate Health Literacy (11/17/2024)   B1300 Health Literacy    Frequency of need for help with medical instructions: Never   Show/hide medication list[1] Allergies[2]  Review of  Systems      Objective    BP 128/84   Pulse 86   Resp 14   Ht 5' 1 (1.549 m)   Wt 157 lb 8 oz (71.4 kg)   LMP 05/30/2013   SpO2 97%   BMI 29.76  kg/m     Physical Exam Vitals reviewed.  Constitutional:      General: She is not in acute distress.    Appearance: Normal appearance. She is well-developed. She is not diaphoretic.  HENT:     Head: Normocephalic and atraumatic.  Eyes:     General: No scleral icterus.    Conjunctiva/sclera: Conjunctivae normal.  Neck:     Thyroid: No thyromegaly.  Cardiovascular:     Rate and Rhythm: Normal rate and regular rhythm.     Heart sounds: Normal heart sounds. No murmur heard. Pulmonary:     Effort: Pulmonary effort is normal. No respiratory distress.     Breath sounds: Normal breath sounds. No wheezing, rhonchi or rales.  Musculoskeletal:     Cervical back: Neck supple.     Right lower leg: No edema.     Left lower leg: No edema.  Lymphadenopathy:     Cervical: No cervical adenopathy.  Skin:    General: Skin is warm and dry.     Findings: No rash.     Comments: +seb keratosis on R flank  Neurological:     Mental Status: She is alert and oriented to person, place, and time. Mental status is at baseline.  Psychiatric:        Mood and Affect: Mood normal.        Behavior: Behavior normal.     Depression Screen    11/17/2024   10:44 AM 08/02/2018    8:58 PM  PHQ 2/9 Scores  PHQ - 2 Score 0 0  PHQ- 9 Score 3    No results found for any visits on 11/17/24.  Assessment & Plan      Problem List Items Addressed This Visit       Cardiovascular and Mediastinum   Hypertension   Well-controlled, likely due to reduced stress after leaving employment. Previously on medication, but discontinued due to improved blood pressure readings.        Respiratory   Allergic rhinitis   Chronic and stable Continue OTC Zyrtec        Other   Depression, major, in remission (HCC)   Major depressive disorder is in remission.  Depression was more prominent during employment, likely exacerbated by work stress. - Continue current management with Cymbalta       Relevant Medications   DULoxetine  (CYMBALTA ) 60 MG capsule   traZODone  (DESYREL ) 50 MG tablet   Tobacco use disorder   She smokes half a pack per day for 20 years. Expresses interest in quitting but is not ready to use medication. Discussed nicotine replacement options and the importance of readiness to quit. - Discussed nicotine replacement options such as patches, gum, and lozenges      Hyperlipidemia   Previous labs showed elevated cholesterol levels. Advised to avoid fried foods and red meats, and increase fiber and plant-based foods. Exercise is recommended but challenging due to winter weather. - Advised dietary modifications to reduce cholesterol - Encouraged regular exercise as feasible      History of melanoma   Malignant melanoma with previous excision. Regular self-monitoring of moles is advised. No current dermatology follow-up due to lack of insurance. - Advised regular self-monitoring of moles - Recommended wearing sunscreen and protective clothing      Overweight (BMI 25.0-29.9)   She is slightly overweight. Weight was stable after leaving employment, but acknowledges difficulty with exercise in winter weather. - Encouraged dietary modifications and exercise as feasible      GAD (  generalized anxiety disorder) - Primary   Well-managed with Cymbalta . She reports improvement in anxiety symptoms since discontinuing work and using Cymbalta . Occasional use of husband's Xanax  for sleep issues, but advised against long-term use due to dementia risk. - Continue Cymbalta  60 mg daily - Prescribed trazodone  50 mg for sleep as needed, starting with half a tablet      Relevant Medications   DULoxetine  (CYMBALTA ) 60 MG capsule   traZODone  (DESYREL ) 50 MG tablet   Other Visit Diagnoses       H/O cold sores         Seborrheic keratosis               Seborrheic keratosis Present, causing irritation due to location. Previously evaluated as benign. Removal is an option if insurance is available. - Advised monitoring for changes - Will consider removal if insurance is available  Herpes labialis Managed with acyclovir  as needed. Reports improvement in frequency and severity of outbreaks with acyclovir  use. - Prescribed acyclovir  for cold sores as needed       Return in about 1 year (around 11/17/2025).      Jon Eva, MD  [facility] [phone] (phone) [phone] (fax)  [facility]    [1]  Outpatient Medications Prior to Visit  Medication Sig   cetirizine (ZYRTEC) 10 MG tablet Take 10 mg by mouth daily.   [DISCONTINUED] acyclovir  (ZOVIRAX ) 400 MG tablet TAKE ONE TABLET 3 TIMES A DAY AS NEEDED.   [DISCONTINUED] DULoxetine  (CYMBALTA ) 60 MG capsule TAKE 1 CAPSULE BY MOUTH DAILY FOR MOOD AND CHRONIC PAIN   [DISCONTINUED] triamcinolone  cream (KENALOG ) 0.5 % Apply 1 application topically 2 (two) times daily.   [DISCONTINUED] ALPRAZolam  (XANAX ) 0.5 MG tablet TAKE ONE-HALF TO ONE TABLET BY MOUTH DAILY AS NEEDED. DO NOT USE MORE THAN 5 DAYS A WEEK.   [DISCONTINUED] Flaxseed, Linseed, (FLAX SEEDS PO) Take by mouth.   [DISCONTINUED] OVER THE COUNTER MEDICATION Uses OTC Flonase nasal spray PRN.   [DISCONTINUED] VITAMIN D  PO Take by mouth.   No facility-administered medications prior to visit.  [2]  Allergies Allergen Reactions   Azithromycin  Nausea And Vomiting   Prednisone      Post menopausal bleeding   "

## 2025-11-21 ENCOUNTER — Encounter: Payer: Self-pay | Admitting: Family Medicine
